# Patient Record
Sex: Female | Born: 1990 | Race: Black or African American | Marital: Single
Health system: Northeastern US, Academic
[De-identification: ages and names within clinical notes are randomized; demographics above are authoritative.]

## PROBLEM LIST (undated history)

## (undated) ENCOUNTER — Inpatient Hospital Stay (HOSPITAL_COMMUNITY): Payer: Self-pay

## (undated) ENCOUNTER — Ambulatory Visit: Payer: Self-pay | Source: Ambulatory Visit | Admitting: Occupational Medicine

## (undated) DIAGNOSIS — R7611 Nonspecific reaction to tuberculin skin test without active tuberculosis: Secondary | ICD-10-CM

---

## 2013-07-19 ENCOUNTER — Telehealth: Payer: Self-pay

## 2013-07-19 NOTE — Telephone Encounter (Signed)
Patient is calling desiring [facility] test [facility]    Her friend Danielle Dessaris helped translate for her.

## 2013-07-24 NOTE — Telephone Encounter (Signed)
lvm for pt to call whp.  **no active insurance**

## 2013-07-25 NOTE — Telephone Encounter (Signed)
Spoke with pt, appt is no longer needed.

## 2013-07-26 ENCOUNTER — Ambulatory Visit
Admit: 2013-07-26 | Discharge: 2013-07-26 | Disposition: A | Discharge status: Home / Self Care | Payer: Self-pay | Source: Ambulatory Visit | Attending: Obstetrics and Gynecology | Admitting: Obstetrics and Gynecology

## 2013-07-26 LAB — RH GROUP: RH Group: POSITIVE

## 2013-07-26 LAB — RH TYPING: Rh Type: POSITIVE

## 2014-02-05 ENCOUNTER — Encounter (HOSPITAL_COMMUNITY): Payer: Self-pay | Admitting: Medical

## 2014-02-05 ENCOUNTER — Inpatient Hospital Stay (HOSPITAL_COMMUNITY)
Admission: AD | Admit: 2014-02-05 | Discharge: 2014-02-06 | Disposition: A | Discharge status: Home / Self Care | Payer: Self-pay | Source: Ambulatory Visit | Attending: Obstetrics & Gynecology | Admitting: Obstetrics & Gynecology

## 2014-02-05 ENCOUNTER — Inpatient Hospital Stay (HOSPITAL_COMMUNITY): Payer: Self-pay

## 2014-02-05 DIAGNOSIS — O99891 Other specified diseases and conditions complicating pregnancy: Secondary | ICD-10-CM | POA: Insufficient documentation

## 2014-02-05 DIAGNOSIS — O9989 Other specified diseases and conditions complicating pregnancy, childbirth and the puerperium: Secondary | ICD-10-CM

## 2014-02-05 DIAGNOSIS — R109 Unspecified abdominal pain: Secondary | ICD-10-CM | POA: Insufficient documentation

## 2014-02-05 LAB — CBC WITH DIFFERENTIAL/PLATELET
Basophils Absolute: 0 10*3/uL (ref 0.0–0.1)
Basophils Relative: 0 % (ref 0–1)
EOS ABS: 0.2 10*3/uL (ref 0.0–0.7)
EOS PCT: 2 % (ref 0–5)
HCT: 32 % — ABNORMAL LOW (ref 36.0–46.0)
HEMOGLOBIN: 10.4 g/dL — AB (ref 12.0–15.0)
Lymphocytes Relative: 41 % (ref 12–46)
Lymphs Abs: 3.8 10*3/uL (ref 0.7–4.0)
MCH: 26.6 pg (ref 26.0–34.0)
MCHC: 32.5 g/dL (ref 30.0–36.0)
MCV: 81.8 fL (ref 78.0–100.0)
MONOS PCT: 7 % (ref 3–12)
Monocytes Absolute: 0.7 10*3/uL (ref 0.1–1.0)
Neutro Abs: 4.6 10*3/uL (ref 1.7–7.7)
Neutrophils Relative %: 50 % (ref 43–77)
Platelets: 172 10*3/uL (ref 150–400)
RBC: 3.91 MIL/uL (ref 3.87–5.11)
RDW: 16.4 % — ABNORMAL HIGH (ref 11.5–15.5)
WBC: 9.3 10*3/uL (ref 4.0–10.5)

## 2014-02-05 LAB — URINALYSIS, ROUTINE W REFLEX MICROSCOPIC
BILIRUBIN URINE: NEGATIVE
GLUCOSE, UA: NEGATIVE mg/dL
Hgb urine dipstick: NEGATIVE
KETONES UR: NEGATIVE mg/dL
Leukocytes, UA: NEGATIVE
Nitrite: NEGATIVE
PH: 5.5 (ref 5.0–8.0)
Protein, ur: NEGATIVE mg/dL
Specific Gravity, Urine: <1.005 — ABNORMAL LOW (ref 1.005–1.030)
Urobilinogen, UA: 0.2 mg/dL (ref 0.0–1.0)

## 2014-02-05 LAB — POCT PREGNANCY, URINE: PREG TEST UR: POSITIVE — AB

## 2014-02-05 LAB — HCG, QUANTITATIVE, PREGNANCY: hCG, Beta Chain, Quant, S: 6102 m[IU]/mL — ABNORMAL HIGH (ref <5)

## 2014-02-05 NOTE — MAU Note (Signed)
Pt reports she has had abd pain for the last 3 days, hurts in the lower part and to the left side. Positive home preg test last week. LMP 12/31/2013

## 2014-02-06 ENCOUNTER — Encounter (HOSPITAL_COMMUNITY): Payer: Self-pay | Admitting: *Deleted

## 2014-02-06 DIAGNOSIS — O9989 Other specified diseases and conditions complicating pregnancy, childbirth and the puerperium: Secondary | ICD-10-CM

## 2014-02-06 DIAGNOSIS — O99891 Other specified diseases and conditions complicating pregnancy: Secondary | ICD-10-CM

## 2014-02-06 DIAGNOSIS — R109 Unspecified abdominal pain: Secondary | ICD-10-CM

## 2014-02-06 LAB — WET PREP, GENITAL
TRICH WET PREP: NONE SEEN
YEAST WET PREP: NONE SEEN

## 2014-02-06 LAB — HIV ANTIBODY (ROUTINE TESTING W REFLEX): HIV 1&2 Ab, 4th Generation: NONREACTIVE

## 2014-02-06 LAB — ABO/RH: ABO/RH(D): O POS

## 2014-02-06 LAB — GC/CHLAMYDIA PROBE AMP
CT Probe RNA: NEGATIVE
GC Probe RNA: NEGATIVE

## 2014-02-06 NOTE — MAU Note (Signed)
Patient discharge home during downtime at 0129 with first trimester pregnancy education sheet.

## 2014-02-06 NOTE — MAU Provider Note (Signed)
History     CSN: [id number]  Arrival date and time: 02/05/14 2033   None     Chief Complaint  Patient presents with  . Dysuria  . Abdominal Pain  . Possible Pregnancy   HPI [name] is a 23 y.o. G1P0 at [redacted]w[redacted]d who presents to MAU today with complain of lower abdominal pain today. She also endorses occasional nausea without vomiting, diarrhea or constipation. She denies vaginal discharge or bleeding.   OB History   Grav Para Term Preterm Abortions TAB SAB Ect Mult Living   1               History reviewed. No pertinent past medical history.  History reviewed. No pertinent past surgical history.  History reviewed. No pertinent family history.  History  Substance Use Topics  . Smoking status: Former Smoker    Quit date: 01/30/2014  . Smokeless tobacco: Not on file  . Alcohol Use: No    Allergies: No Known Allergies  No prescriptions prior to admission    Review of Systems  Constitutional: Negative for malaise/fatigue.  Gastrointestinal: Positive for nausea and abdominal pain. Negative for vomiting, diarrhea and constipation.  Genitourinary:       Neg - vaginal bleeding, discharge   Physical Exam   Blood pressure 109/66, pulse 79, temperature 99.2 F (37.3 C), temperature source Oral, resp. rate 18, height 5\' 4"  (1.626 m), weight 156 lb (70.761 kg), last menstrual period 12/31/2013, SpO2 100.00%, unknown if currently breastfeeding.  Physical Exam  Constitutional: She is oriented to person, place, and time. She appears well-developed and well-nourished.  HENT:  Head: Normocephalic.  Cardiovascular: Normal rate.   Respiratory: Effort normal.  GI: Soft. She exhibits no distension and no mass. There is no tenderness. There is no rebound and no guarding.  Genitourinary: Uterus is not enlarged and not tender. Cervix exhibits no motion tenderness, no discharge and no friability. Right adnexum displays no mass and no tenderness. Left adnexum displays no  mass and no tenderness. No bleeding around the vagina. Vaginal discharge (scant thin, white discharge noted) found.  Neurological: She is alert and oriented to person, place, and time.  Skin: Skin is warm and dry. No erythema.  Psychiatric: She has a normal mood and affect.   Results for orders placed during the hospital encounter of 02/05/14 (from the past 24 hour(s))  URINALYSIS, ROUTINE W REFLEX MICROSCOPIC     Status: Abnormal   Collection Time    02/05/14  8:40 PM      Result Value Ref Range   Color, Urine YELLOW  YELLOW   APPearance CLEAR  CLEAR   Specific Gravity, Urine <1.005 (*) 1.005 - 1.030   pH 5.5  5.0 - 8.0   Glucose, UA NEGATIVE  NEGATIVE mg/dL   Hgb urine dipstick NEGATIVE  NEGATIVE   Bilirubin Urine NEGATIVE  NEGATIVE   Ketones, ur NEGATIVE  NEGATIVE mg/dL   Protein, ur NEGATIVE  NEGATIVE mg/dL   Urobilinogen, UA 0.2  0.0 - 1.0 mg/dL   Nitrite NEGATIVE  NEGATIVE   Leukocytes, UA NEGATIVE  NEGATIVE  POCT PREGNANCY, URINE     Status: Abnormal   Collection Time    02/05/14  8:50 PM      Result Value Ref Range   Preg Test, Ur POSITIVE (*) NEGATIVE  ABO/RH     Status: None   Collection Time    02/05/14 10:02 PM      Result Value Ref Range   ABO/RH(D) [place]  POS    CBC WITH DIFFERENTIAL     Status: Abnormal   Collection Time    02/05/14 10:07 PM      Result Value Ref Range   WBC 9.3  4.0 - 10.5 K/uL   RBC 3.91  3.87 - 5.11 MIL/uL   Hemoglobin 10.4 (*) 12.0 - 15.0 g/dL   HCT 93.8 (*) 18.2 - 99.3 %   MCV 81.8  78.0 - 100.0 fL   MCH 26.6  26.0 - 34.0 pg   MCHC 32.5  30.0 - 36.0 g/dL   RDW 71.6 (*) 96.7 - 89.3 %   Platelets 172  150 - 400 K/uL   Neutrophils Relative % 50  43 - 77 %   Neutro Abs 4.6  1.7 - 7.7 K/uL   Lymphocytes Relative 41  12 - 46 %   Lymphs Abs 3.8  0.7 - 4.0 K/uL   Monocytes Relative 7  3 - 12 %   Monocytes Absolute 0.7  0.1 - 1.0 K/uL   Eosinophils Relative 2  0 - 5 %   Eosinophils Absolute 0.2  0.0 - 0.7 K/uL   Basophils Relative 0  0 - 1 %    Basophils Absolute 0.0  0.0 - 0.1 K/uL  HCG, QUANTITATIVE, PREGNANCY     Status: Abnormal   Collection Time    02/05/14 10:07 PM      Result Value Ref Range   hCG, Beta Chain, Quant, S 6102 (*) <5 mIU/mL  WET PREP, GENITAL     Status: Abnormal   Collection Time    02/06/14 12:38 AM      Result Value Ref Range   Yeast Wet Prep HPF POC NONE SEEN  NONE SEEN   Trich, Wet Prep NONE SEEN  NONE SEEN   Clue Cells Wet Prep HPF POC FEW (*) NONE SEEN   WBC, Wet Prep HPF POC FEW (*) NONE SEEN    US Ob Comp Less 14 Wks  02/05/2014   CLINICAL DATA:  Left lower quadrant abdominal pain.  EXAM: OBSTETRIC <14 WK [place] AND TRANSVAGINAL OB US  TECHNIQUE: Both transabdominal and transvaginal ultrasound examinations were performed for complete evaluation of the gestation as well as the maternal uterus, adnexal regions, and pelvic cul-de-sac. Transvaginal technique was performed to assess early pregnancy.  COMPARISON:  None.  FINDINGS: Intrauterine gestational sac: Visualized/normal in shape.  Yolk sac:  Yes  Embryo:  No  Cardiac Activity: N/A  MSD: 6.6 mm   5 w   2  d  Maternal uterus/adnexae: No subchorionic hemorrhage is noted. The uterus is otherwise unremarkable in appearance.  The ovaries are within normal limits. The right ovary measures 3.0 x 1.8 x 2.3 cm, while the left ovary measures 2.9 x 2.2 x 2.3 cm. No suspicious adnexal masses are seen; there is no evidence for ovarian torsion.  No free fluid is seen within the pelvic cul-de-sac.  IMPRESSION: Single intrauterine gestational sac noted, with a mean sac diameter of 6.6 mm, corresponding to a gestational age of [redacted] weeks 2 days. This matches the gestational age of [redacted] weeks 1 day by LMP, reflecting an estimated date of delivery of October 07, 2014. The embryo is not yet visualized.   Electronically Signed   By: Roanna Raider M.D.   On: 02/05/2014 23:15   US Ob Transvaginal  02/05/2014   CLINICAL DATA:  Left lower quadrant abdominal pain.  EXAM: OBSTETRIC <14 WK  [place] AND TRANSVAGINAL OB US  TECHNIQUE: Both transabdominal  and transvaginal ultrasound examinations were performed for complete evaluation of the gestation as well as the maternal uterus, adnexal regions, and pelvic cul-de-sac. Transvaginal technique was performed to assess early pregnancy.  COMPARISON:  None.  FINDINGS: Intrauterine gestational sac: Visualized/normal in shape.  Yolk sac:  Yes  Embryo:  No  Cardiac Activity: N/A  MSD: 6.6 mm   5 w   2  d  Maternal uterus/adnexae: No subchorionic hemorrhage is noted. The uterus is otherwise unremarkable in appearance.  The ovaries are within normal limits. The right ovary measures 3.0 x 1.8 x 2.3 cm, while the left ovary measures 2.9 x 2.2 x 2.3 cm. No suspicious adnexal masses are seen; there is no evidence for ovarian torsion.  No free fluid is seen within the pelvic cul-de-sac.  IMPRESSION: Single intrauterine gestational sac noted, with a mean sac diameter of 6.6 mm, corresponding to a gestational age of [redacted] weeks 2 days. This matches the gestational age of [redacted] weeks 1 day by LMP, reflecting an estimated date of delivery of October 07, 2014. The embryo is not yet visualized.   Electronically Signed   By: Roanna RaiderJeffery  Chang M.D.   On: 02/05/2014 23:15     MAU Course  Procedures None  MDM +UPT UA, wet prep, GC/Chlamydia, CBC, ABO/Rh, quant hCG and US today  Assessment and Plan  A: SIUP at 5948w2d Abdominal pain in pregnancy, antepartum  P: Discharge home First trimester warning signs discussed Patient plans to move back to [place] within 1-2 months. Will start prenatal care there.  Patient may return to MAU as needed or if her condition were to change or worsen   Freddi StarrJulie N Ethier, PA-C  02/06/2014, 4:46 AM

## 2014-02-06 NOTE — MAU Provider Note (Signed)
Attestation of Attending Supervision of Advanced Practitioner (CNM/NP): Evaluation and management procedures were performed by the Advanced Practitioner under my supervision and collaboration.  I have reviewed the Advanced Practitioner's note and chart, and I agree with the management and plan.  HARRAWAY-SMITH, Sabree Nuon 4:52 AM

## 2014-04-30 ENCOUNTER — Encounter (HOSPITAL_COMMUNITY): Payer: Self-pay | Admitting: *Deleted

## 2014-05-07 ENCOUNTER — Ambulatory Visit
Admit: 2014-05-07 | Discharge: 2014-05-07 | Disposition: A | Discharge status: Home / Self Care | Payer: Self-pay | Source: Ambulatory Visit | Admitting: Internal Medicine

## 2014-12-11 ENCOUNTER — Encounter (HOSPITAL_COMMUNITY): Payer: Self-pay | Admitting: *Deleted

## 2015-05-12 DIAGNOSIS — Z862 Personal history of diseases of the blood and blood-forming organs and certain disorders involving the immune mechanism: Secondary | ICD-10-CM | POA: Insufficient documentation

## 2015-09-03 ENCOUNTER — Other Ambulatory Visit: Payer: Self-pay | Admitting: Pulmonology

## 2015-09-17 DIAGNOSIS — R7611 Nonspecific reaction to tuberculin skin test without active tuberculosis: Secondary | ICD-10-CM | POA: Insufficient documentation

## 2015-11-27 ENCOUNTER — Other Ambulatory Visit
Admission: RE | Admit: 2015-11-27 | Discharge: 2015-11-27 | Disposition: A | Discharge status: Home / Self Care | Payer: Self-pay | Source: Ambulatory Visit | Attending: General Practice | Admitting: General Practice

## 2015-11-28 LAB — SYPHILIS SCREEN
Syphilis Screen: NEGATIVE
Syphilis Status: NONREACTIVE

## 2015-11-28 LAB — MUMPS ANTIBODY, IGG: Mumps IgG: POSITIVE

## 2015-11-28 LAB — HEPATITIS A ANTIBODY, IGM: Hep A IgM: NEGATIVE

## 2015-11-28 LAB — VARICELLA ZOSTER IGG AB: VZV IgG: POSITIVE

## 2015-11-28 LAB — N. GONORRHOEAE DNA AMPLIFICATION: N. gonorrhoeae DNA Amplification: 0

## 2015-11-28 LAB — MEASLES IGG AB: Measles IgG: POSITIVE

## 2015-11-28 LAB — RUBELLA ANTIBODY, IGG: Rubella IgG AB: POSITIVE

## 2015-11-28 LAB — HEPATITIS A IGG AB: Hepatitis A IGG: POSITIVE

## 2016-02-03 IMAGING — US US OB COMP LESS 14 WK
1 series · 13 of 28 positions shown · non-contrast
Comparison: None.

CLINICAL DATA: Left lower quadrant abdominal pain.

EXAM:
OBSTETRIC <14 WK US AND TRANSVAGINAL OB US
TECHNIQUE: Both transabdominal and transvaginal ultrasound examinations were
performed for complete evaluation of the gestation as well as the
maternal uterus, adnexal regions, and pelvic cul-de-sac.
Transvaginal technique was performed to assess early pregnancy.

[Series 1: us ob comp less 14 wks · 13 of 42 slices shown]
[im 2/42]
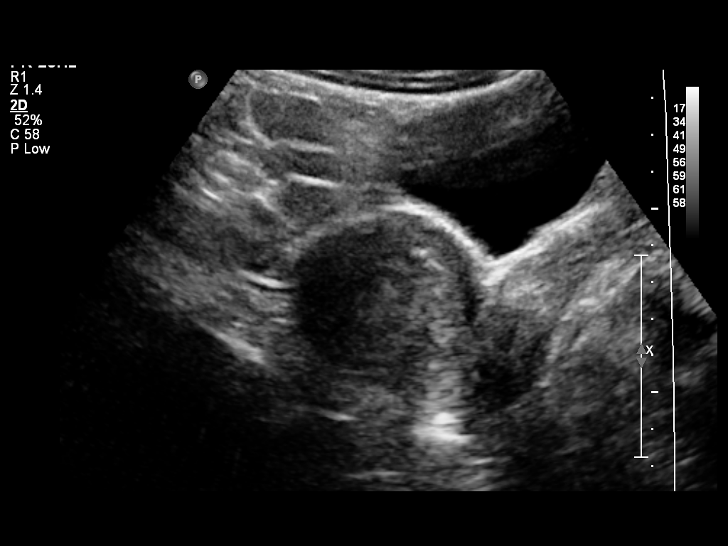
[im 5/42]
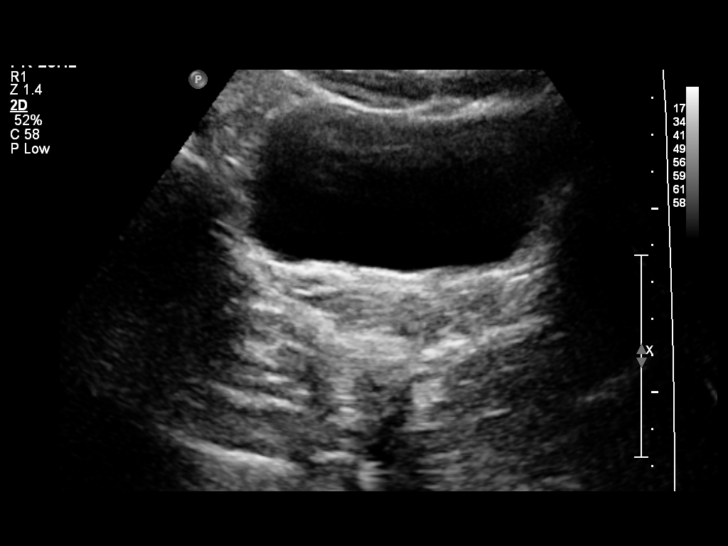
[im 8/42]
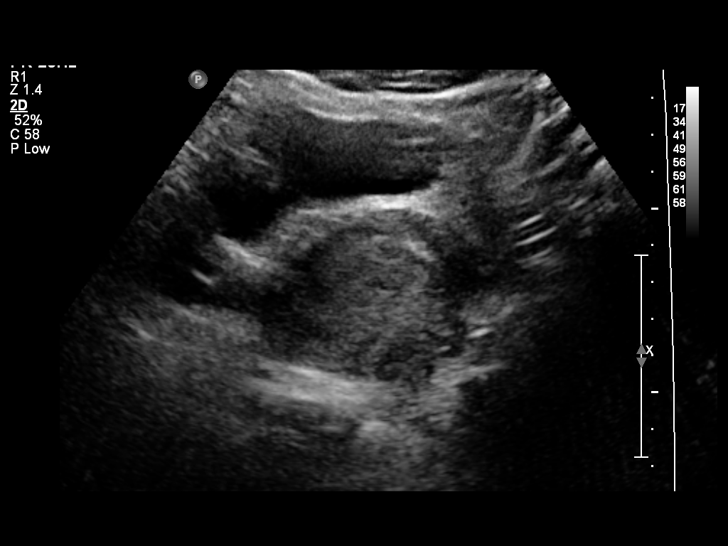
[im 11/42]
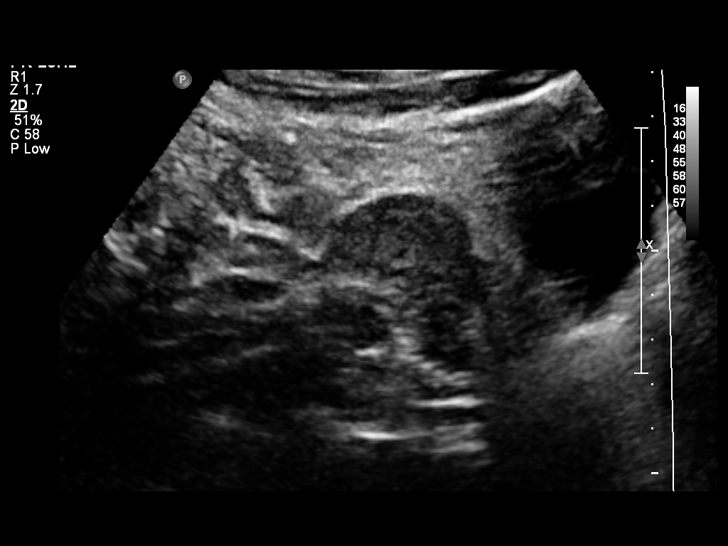
[im 14/42]
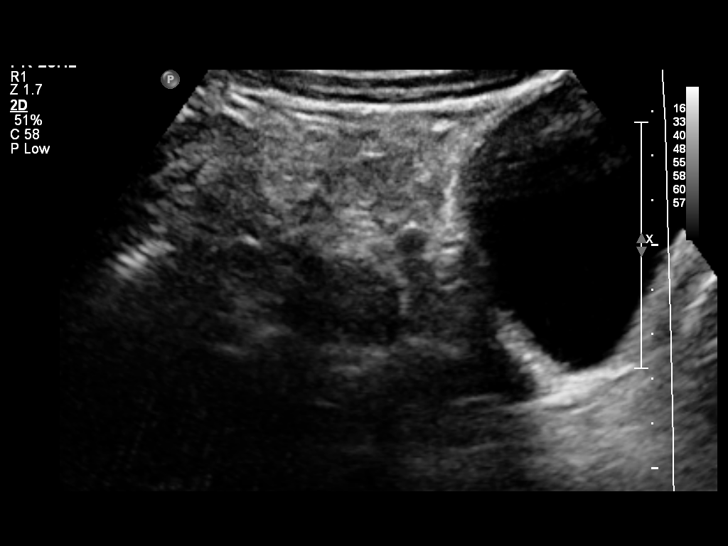
[im 17/42]
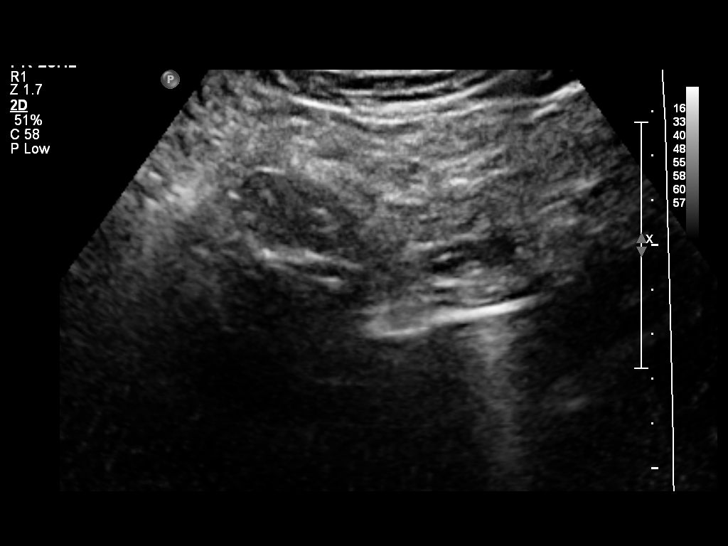
[im 22/42]
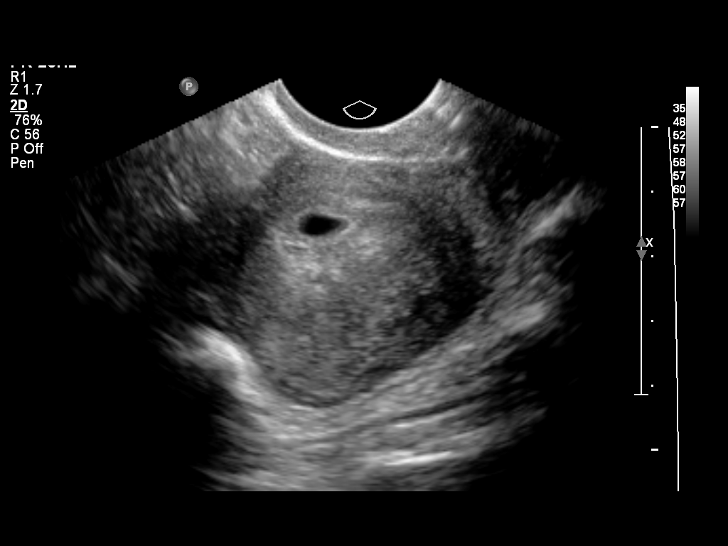
[im 25/42]
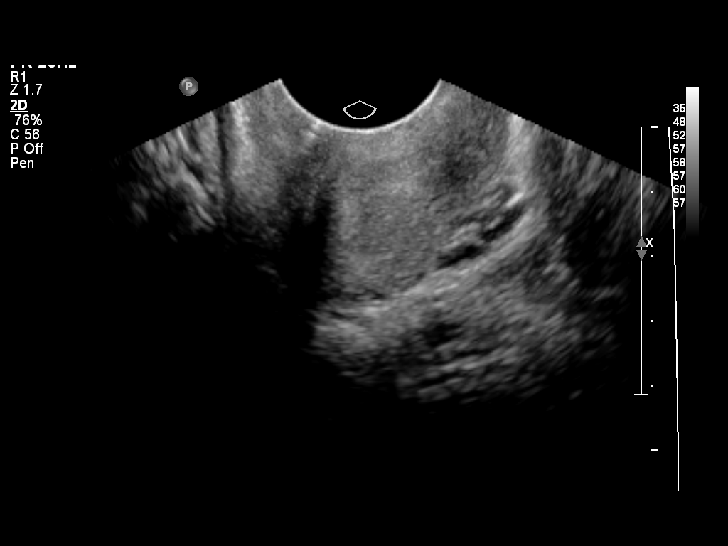
[im 28/42]
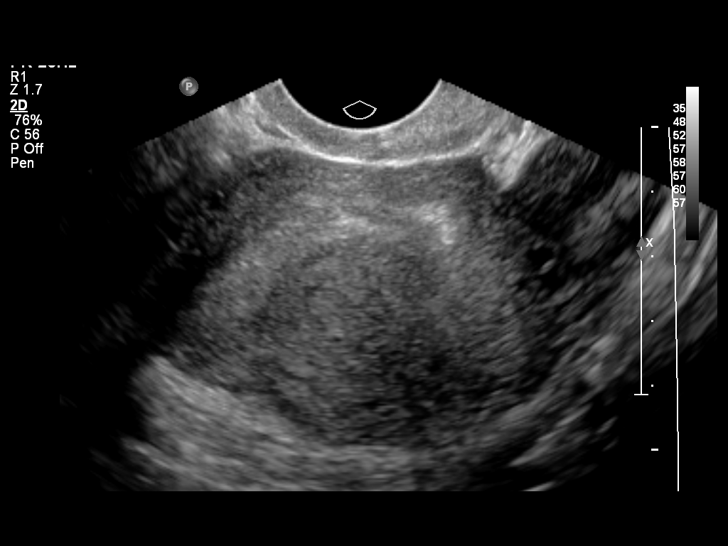
[im 31/42]
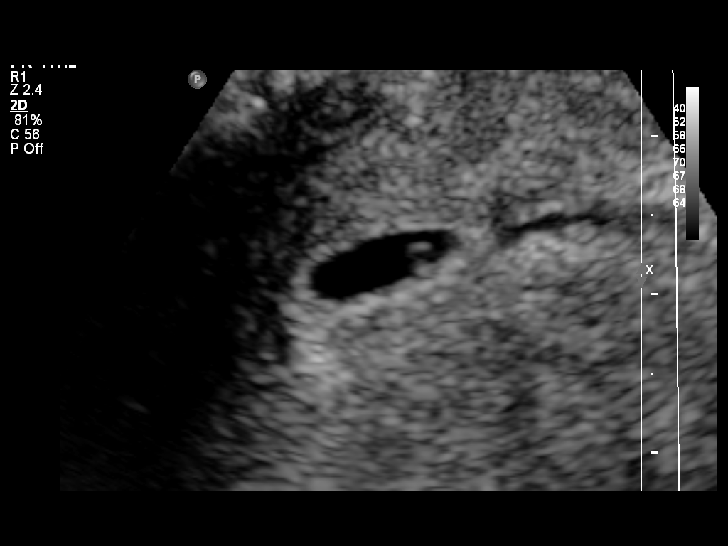
[im 34/42]
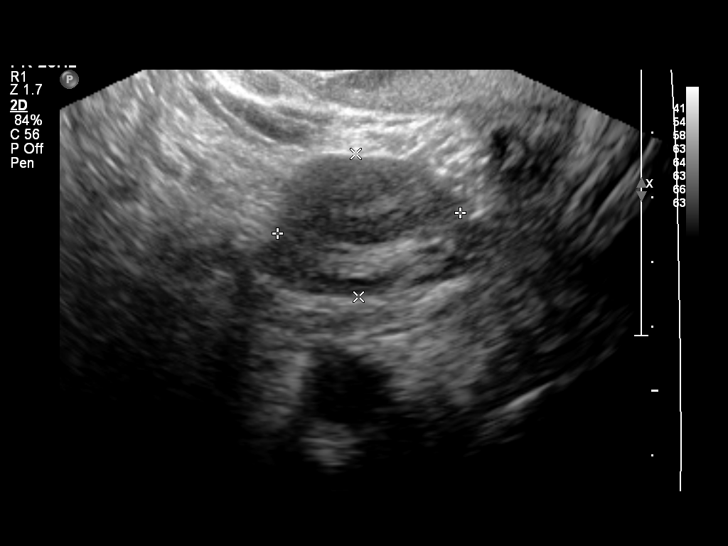
[im 37/42]
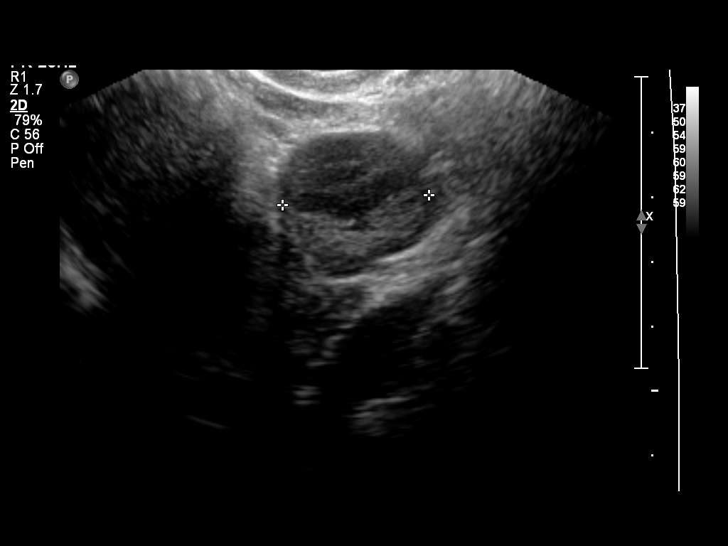
[im 40/42]
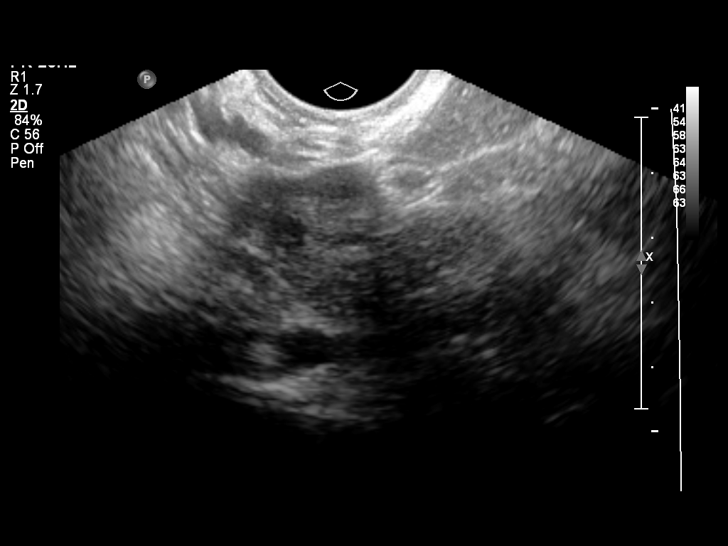

[13 of 28 positions shown; findings below may reference images not displayed]

FINDINGS: Intrauterine gestational sac: Visualized/normal in shape.

Yolk sac:  Yes

Embryo:  No

Cardiac Activity: N/A

MSD: 6.6 mm   5 w   2  d

Maternal uterus/adnexae: No subchorionic hemorrhage is noted. The
uterus is otherwise unremarkable in appearance.

The ovaries are within normal limits. The right ovary measures 3.0 x
1.8 x 2.3 cm, while the left ovary measures 2.9 x 2.2 x 2.3 cm. No
suspicious adnexal masses are seen; there is no evidence for ovarian
torsion.

No free fluid is seen within the pelvic cul-de-sac.
IMPRESSION: Single intrauterine gestational sac noted, with a mean sac diameter
of 6.6 mm, corresponding to a gestational age of 5 weeks 2 days.
This matches the gestational age of 5 weeks 1 day by LMP, reflecting
an estimated date of delivery October 07, 2014. The embryo is not
yet visualized.

## 2016-06-17 ENCOUNTER — Inpatient Hospital Stay (HOSPITAL_COMMUNITY)
Admission: AD | Admit: 2016-06-17 | Discharge: 2016-06-17 | Disposition: A | Discharge status: Home / Self Care | Payer: Medicaid Other | Source: Ambulatory Visit | Attending: Obstetrics & Gynecology | Admitting: Obstetrics & Gynecology

## 2016-06-17 DIAGNOSIS — Z3201 Encounter for pregnancy test, result positive: Secondary | ICD-10-CM | POA: Insufficient documentation

## 2016-06-18 ENCOUNTER — Encounter: Payer: Self-pay | Admitting: *Deleted

## 2016-06-18 ENCOUNTER — Ambulatory Visit (INDEPENDENT_AMBULATORY_CARE_PROVIDER_SITE_OTHER): Payer: Medicaid Other | Admitting: *Deleted

## 2016-06-18 DIAGNOSIS — Z3201 Encounter for pregnancy test, result positive: Secondary | ICD-10-CM | POA: Diagnosis not present

## 2016-06-18 DIAGNOSIS — Z3200 Encounter for pregnancy test, result unknown: Secondary | ICD-10-CM

## 2016-06-18 DIAGNOSIS — Z3A01 Less than 8 weeks gestation of pregnancy: Secondary | ICD-10-CM

## 2016-06-18 LAB — POCT PREGNANCY, URINE: Preg Test, Ur: POSITIVE — AB

## 2016-06-18 MED ORDER — PRENATAL VITAMINS 0.8 MG PO TABS: 1.0000 | ORAL_TABLET | Freq: Every day | ORAL | 12 refills | Status: DC

## 2016-06-18 NOTE — Progress Notes (Signed)
Patient presents to clinic for pregnancy test which is positive. Reviewed allergies and medications, prescription sent for prenatal vitamins. Patient will not be starting care at this clinic, pregnancy verification letter given.

## 2016-07-02 ENCOUNTER — Inpatient Hospital Stay (HOSPITAL_COMMUNITY): Payer: Medicaid Other

## 2016-07-02 ENCOUNTER — Inpatient Hospital Stay (HOSPITAL_COMMUNITY)
Admission: AD | Admit: 2016-07-02 | Discharge: 2016-07-02 | Disposition: A | Discharge status: Home / Self Care | Payer: Medicaid Other | Source: Ambulatory Visit | Attending: Obstetrics & Gynecology | Admitting: Obstetrics & Gynecology

## 2016-07-02 ENCOUNTER — Encounter (HOSPITAL_COMMUNITY): Payer: Self-pay | Admitting: *Deleted

## 2016-07-02 DIAGNOSIS — Z3A01 Less than 8 weeks gestation of pregnancy: Secondary | ICD-10-CM | POA: Diagnosis not present

## 2016-07-02 DIAGNOSIS — Z87891 Personal history of nicotine dependence: Secondary | ICD-10-CM | POA: Insufficient documentation

## 2016-07-02 DIAGNOSIS — O26899 Other specified pregnancy related conditions, unspecified trimester: Secondary | ICD-10-CM

## 2016-07-02 DIAGNOSIS — R109 Unspecified abdominal pain: Secondary | ICD-10-CM

## 2016-07-02 DIAGNOSIS — O26892 Other specified pregnancy related conditions, second trimester: Secondary | ICD-10-CM | POA: Diagnosis present

## 2016-07-02 DIAGNOSIS — O208 Other hemorrhage in early pregnancy: Secondary | ICD-10-CM | POA: Insufficient documentation

## 2016-07-02 DIAGNOSIS — N8312 Corpus luteum cyst of left ovary: Secondary | ICD-10-CM | POA: Diagnosis not present

## 2016-07-02 DIAGNOSIS — O9989 Other specified diseases and conditions complicating pregnancy, childbirth and the puerperium: Secondary | ICD-10-CM

## 2016-07-02 DIAGNOSIS — Z3491 Encounter for supervision of normal pregnancy, unspecified, first trimester: Secondary | ICD-10-CM

## 2016-07-02 DIAGNOSIS — O468X1 Other antepartum hemorrhage, first trimester: Secondary | ICD-10-CM

## 2016-07-02 DIAGNOSIS — O418X10 Other specified disorders of amniotic fluid and membranes, first trimester, not applicable or unspecified: Secondary | ICD-10-CM

## 2016-07-02 LAB — URINALYSIS, ROUTINE W REFLEX MICROSCOPIC
BILIRUBIN URINE: NEGATIVE
Glucose, UA: NEGATIVE mg/dL
HGB URINE DIPSTICK: NEGATIVE
Ketones, ur: NEGATIVE mg/dL
Leukocytes, UA: NEGATIVE
Nitrite: NEGATIVE
PROTEIN: NEGATIVE mg/dL
Specific Gravity, Urine: 1.019 (ref 1.005–1.030)
pH: 6 (ref 5.0–8.0)

## 2016-07-02 LAB — CBC
HCT: 30.9 % — ABNORMAL LOW (ref 36.0–46.0)
Hemoglobin: 10.1 g/dL — ABNORMAL LOW (ref 12.0–15.0)
MCH: 25.3 pg — AB (ref 26.0–34.0)
MCHC: 32.7 g/dL (ref 30.0–36.0)
MCV: 77.4 fL — AB (ref 78.0–100.0)
Platelets: 147 10*3/uL — ABNORMAL LOW (ref 150–400)
RBC: 3.99 MIL/uL (ref 3.87–5.11)
RDW: 16.2 % — ABNORMAL HIGH (ref 11.5–15.5)
WBC: 5.2 10*3/uL (ref 4.0–10.5)

## 2016-07-02 LAB — WET PREP, GENITAL
Clue Cells Wet Prep HPF POC: NONE SEEN
Sperm: NONE SEEN
Trich, Wet Prep: NONE SEEN
WBC, Wet Prep HPF POC: NONE SEEN
Yeast Wet Prep HPF POC: NONE SEEN

## 2016-07-02 LAB — HCG, QUANTITATIVE, PREGNANCY: HCG, BETA CHAIN, QUANT, S: 52521 m[IU]/mL — AB (ref <5)

## 2016-07-02 NOTE — MAU Note (Signed)
Having pain in lower abd, makes her want to go to the bathroom.  Has pain when she goes to bathroom.  Last night she saw a little blood when she went to the bathroom (urinated). Having pain in left lower back.  Had a fever 3 days ago.  +CVA tenderness

## 2016-07-02 NOTE — Discharge Instructions (Signed)
Subchorionic Hematoma °A subchorionic hematoma is a gathering of blood between the outer wall of the placenta and the inner wall of the womb (uterus). The placenta is the organ that connects the fetus to the wall of the uterus. The placenta performs the feeding, breathing (oxygen to the fetus), and waste removal (excretory work) of the fetus.  °Subchorionic hematoma is the most common abnormality found on a result from ultrasonography done during the first trimester or early second trimester of pregnancy. If there has been little or no vaginal bleeding, early small hematomas usually shrink on their own and do not affect your baby or pregnancy. The blood is gradually absorbed over 1-2 weeks. When bleeding starts later in pregnancy or the hematoma is larger or occurs in an older pregnant woman, the outcome may not be as good. Larger hematomas may get bigger, which increases the chances for miscarriage. Subchorionic hematoma also increases the risk of premature detachment of the placenta from the uterus, preterm (premature) labor, and stillbirth. °HOME CARE INSTRUCTIONS °· Stay on bed rest if your health care provider recommends this. Although bed rest will not prevent more bleeding or prevent a miscarriage, your health care provider may recommend bed rest until you are advised otherwise. °· Avoid heavy lifting (more than 10 lb [4.5 kg]), exercise, sexual intercourse, or douching as directed by your health care provider. °· Keep track of the number of pads you use each day and how soaked (saturated) they are. Write down this information. °· Do not use tampons. °· Keep all follow-up appointments as directed by your health care provider. Your health care provider may ask you to have follow-up blood tests or ultrasound tests or both. °SEEK IMMEDIATE MEDICAL CARE IF: °· You have severe cramps in your stomach, back, abdomen, or pelvis. °· You have a fever. °· You pass large clots or tissue. Save any tissue for your health  care provider to look at. °· Your bleeding increases or you become lightheaded, feel weak, or have fainting episodes. °This information is not intended to replace advice given to you by your health care provider. Make sure you discuss any questions you have with your health care provider. °Document Released: 09/29/2006 Document Revised: 07/05/2014 Document Reviewed: 01/11/2013 °Elsevier Interactive Patient Education © 2017 [employer] ° °

## 2016-07-02 NOTE — MAU Provider Note (Signed)
History     CSN: [id number]  Arrival date and time: 07/02/16 1111   First Provider Initiated Contact with Patient 07/02/16 1150      Chief Complaint  Patient presents with  . UTI symptoms  . Back Pain   HPI Ariana Hammond is a 26 y.o. G2P0 at [redacted]w[redacted]d by LMP who presents with abdominal pain & vaginal bleeding. Pain began last night. Describes as lower abdominal cramping. Rates pain 8/10. No treatment. Noted red spotting on toilet paper last night; none since. Some nausea. Denied vomiting, diarrhea, constipation, dysuria, or vaginal discharge. Has not started prenatal care. No fever/chills.   OB History    Gravida Para Term Preterm AB Living   2         1   SAB TAB Ectopic Multiple Live Births                  History reviewed. No pertinent past medical history.  Past Surgical History:  Procedure Laterality Date  . CESAREAN SECTION      No family history on file.  Social History  Substance Use Topics  . Smoking status: Former Smoker    Quit date: 01/30/2014  . Smokeless tobacco: Former Neurosurgeon  . Alcohol use Yes     Comment: Social    Allergies: No Known Allergies  Prescriptions Prior to Admission  Medication Sig Dispense Refill Last Dose  . Prenatal Multivit-Min-Fe-FA (PRENATAL VITAMINS) 0.8 MG tablet Take 1 tablet by mouth daily. (Patient not taking: Reported on 07/02/2016) 30 tablet 12 Not Taking at Unknown time    Review of Systems  Constitutional: Negative.   Respiratory: Negative for shortness of breath.   Cardiovascular: Negative for chest pain.  Gastrointestinal: Negative for abdominal pain, nausea and vomiting.  Genitourinary: Negative for vaginal bleeding.  Neurological: Negative for dizziness and headaches.   Physical Exam   Blood pressure 106/63, pulse 71, temperature 98.2 F (36.8 C), temperature source Oral, resp. rate 18, weight 164 lb 8 oz (74.6 kg), last menstrual period 05/12/2016, SpO2 100 %, unknown if currently breastfeeding.  Physical Exam   Nursing note and vitals reviewed. Constitutional: She is oriented to person, place, and time. She appears well-developed and well-nourished. No distress.  HENT:  Head: Normocephalic and atraumatic.  Eyes: Conjunctivae are normal. Right eye exhibits no discharge. Left eye exhibits no discharge. No scleral icterus.  Neck: Normal range of motion.  Cardiovascular: Normal rate, regular rhythm and normal heart sounds.   No murmur heard. Respiratory: Effort normal and breath sounds normal. No respiratory distress. She has no wheezes.  GI: Soft. Bowel sounds are normal. She exhibits no distension. There is no tenderness. There is no CVA tenderness.  Genitourinary: No bleeding in the vagina. Vaginal discharge (small amount of thin white discharge) found.  Musculoskeletal: Edema: BLE 2+  Neurological: She is alert and oriented to person, place, and time.  Skin: Skin is warm and dry. She is not diaphoretic.  Psychiatric: She has a normal mood and affect. Her behavior is normal. Judgment and thought content normal.    MAU Course  Procedures Results for orders placed or performed during the hospital encounter of 07/02/16 (from the past 24 hour(s))  Urinalysis, Routine w reflex microscopic     Status: Abnormal   Collection Time: 07/02/16 11:25 AM  Result Value Ref Range   Color, Urine YELLOW YELLOW   APPearance HAZY (A) CLEAR   Specific Gravity, Urine 1.019 1.005 - 1.030   pH 6.0 5.0 - 8.0  Glucose, UA NEGATIVE NEGATIVE mg/dL   Hgb urine dipstick NEGATIVE NEGATIVE   Bilirubin Urine NEGATIVE NEGATIVE   Ketones, ur NEGATIVE NEGATIVE mg/dL   Protein, ur NEGATIVE NEGATIVE mg/dL   Nitrite NEGATIVE NEGATIVE   Leukocytes, UA NEGATIVE NEGATIVE  hCG, quantitative, pregnancy     Status: Abnormal   Collection Time: 07/02/16 12:12 PM  Result Value Ref Range   hCG, Beta Chain, Quant, S 52,521 (H) <5 mIU/mL  Wet prep, genital     Status: None   Collection Time: 07/02/16 12:28 PM  Result Value Ref  Range   Yeast Wet Prep HPF POC NONE SEEN NONE SEEN   Trich, Wet Prep NONE SEEN NONE SEEN   Clue Cells Wet Prep HPF POC NONE SEEN NONE SEEN   WBC, Wet Prep HPF POC NONE SEEN NONE SEEN   Sperm NONE SEEN   CBC     Status: Abnormal   Collection Time: 07/02/16 12:41 PM  Result Value Ref Range   WBC 5.2 4.0 - 10.5 K/uL   RBC 3.99 3.87 - 5.11 MIL/uL   Hemoglobin 10.1 (L) 12.0 - 15.0 g/dL   HCT 11.930.9 (L) 14.736.0 - 82.946.0 %   MCV 77.4 (L) 78.0 - 100.0 fL   MCH 25.3 (L) 26.0 - 34.0 pg   MCHC 32.7 30.0 - 36.0 g/dL   RDW 56.216.2 (H) 13.011.5 - 86.515.5 %   Platelets 147 (L) 150 - 400 K/uL   [place] Ob Comp Less 14 Wks  Result Date: 07/02/2016 CLINICAL DATA:  Abdominal pain affecting pregnancy ; EGA [redacted] weeks 2 days by LMP, quantitative beta HCG = 52,521 EXAM: OBSTETRIC <14 WK US AND TRANSVAGINAL OB US TECHNIQUE: Both transabdominal and transvaginal ultrasound examinations were performed for complete evaluation of the gestation as well as the maternal uterus, adnexal regions, and pelvic cul-de-sac. Transvaginal technique was performed to assess early pregnancy. COMPARISON:  None for this gestation FINDINGS: Intrauterine gestational sac: Present Yolk sac:  Present Embryo:  Present Cardiac Activity: Present Heart Rate: 130  bpm CRL:  8.9  mm   6 w   5 d                  US EDC: 02/20/2017 Subchorionic hemorrhage: Small subchronic hemorrhage 2.3 x 1.3 x 2.8 cm. Maternal uterus/adnexae: Uterus otherwise normal appearance. Ovaries normal size morphology with small corpus luteal cyst in LEFT ovary. Small amount of free pelvic fluid. No adnexal masses. IMPRESSION: Single live intrauterine gestation measured at 6 weeks 5 days EGA by crown-rump length. Small subchorionic hemorrhage as above. Small amount of nonspecific free pelvic fluid. Electronically Signed   By: Ulyses SouthwardMark  Boles M.D.   On: 07/02/2016 14:09   [place] Ob Transvaginal  Result Date: 07/02/2016 CLINICAL DATA:  Abdominal pain affecting pregnancy ; EGA [redacted] weeks 2 days by LMP,  quantitative beta HCG = 52,521 EXAM: OBSTETRIC <14 WK US AND TRANSVAGINAL OB US TECHNIQUE: Both transabdominal and transvaginal ultrasound examinations were performed for complete evaluation of the gestation as well as the maternal uterus, adnexal regions, and pelvic cul-de-sac. Transvaginal technique was performed to assess early pregnancy. COMPARISON:  None for this gestation FINDINGS: Intrauterine gestational sac: Present Yolk sac:  Present Embryo:  Present Cardiac Activity: Present Heart Rate: 130  bpm CRL:  8.9  mm   6 w   5 d                  US EDC: 02/20/2017 Subchorionic hemorrhage: Small subchronic hemorrhage 2.3 x 1.3 x  2.8 cm. Maternal uterus/adnexae: Uterus otherwise normal appearance. Ovaries normal size morphology with small corpus luteal cyst in LEFT ovary. Small amount of free pelvic fluid. No adnexal masses. IMPRESSION: Single live intrauterine gestation measured at 6 weeks 5 days EGA by crown-rump length. Small subchorionic hemorrhage as above. Small amount of nonspecific free pelvic fluid. Electronically Signed   By: Ulyses Southward M.D.   On: 07/02/2016 14:09    MDM +UPT UA, wet prep, GC/chlamydia, CBC, ABO/Rh, quant hCG, HIV, and [place] today to rule out ectopic pregnancy O positive Ultrasound shows SIUP with cardiac activity & small SCH  Assessment and Plan  A:  1. Normal IUP (intrauterine pregnancy) on prenatal ultrasound, first trimester   2. Abdominal pain affecting pregnancy   3. Subchorionic hematoma in first trimester, single or unspecified fetus    P: Discharge home GC/CT pending Start prenatal care Discussed reasons to return to MAU  Judeth Horn 07/02/2016, 12:22 PM

## 2016-07-02 NOTE — MAU Provider Note (Signed)
History     CSN: [id number]  Arrival date and time: 07/02/16 1111   First Provider Initiated Contact with Patient 07/02/16 1150      Chief Complaint  Patient presents with  . UTI symptoms  . Back Pain   HPI Ariana Hammond is a 26 y.o. G2P0 female, currently [redacted]w[redacted]d, who presents to day for evaluation of abdominal pain. Patient reports the pain began last night and she describes the pain as cramping, mainly in the lower abdomen and lower back. Patient reports last night when she went to the bathroom she noted some bleeding, but reports no bleeding today. Patient reports the pain is an 8/10 and has tried nothing to relieve the pain. Patient reports nausea and poor apetitie since the pain began, but no episodes of vomiting. Patient does report some constipation, her last bowel movement was yesterday morning and she reports it was firm. Denies fevers, chills, dysuria or urinary frequency.  Patient reports she last had intercourse on 07/01/15, with no pain or bleeding. Patient has not yet been seen for prenatal care but has an appointment with [facility] at the end of the month.   OB History    Gravida Para Term Preterm AB Living   2         1   SAB TAB Ectopic Multiple Live Births                  History reviewed. No pertinent past medical history.  Past Surgical History:  Procedure Laterality Date  . CESAREAN SECTION      No family history on file.  Social History  Substance Use Topics  . Smoking status: Former Smoker    Quit date: 01/30/2014  . Smokeless tobacco: Former Neurosurgeon  . Alcohol use Yes     Comment: Social    Allergies: No Known Allergies  No prescriptions prior to admission.    Review of Systems  Constitutional: Positive for appetite change. Negative for chills and fever.  Gastrointestinal: Positive for abdominal pain, constipation and nausea. Negative for diarrhea and vomiting.  Genitourinary: Positive for pelvic pain. Negative for dysuria, frequency  and vaginal discharge.  Neurological: Negative for dizziness and light-headedness.   Physical Exam   Blood pressure 106/63, pulse 71, temperature 98.2 F (36.8 C), temperature source Oral, resp. rate 18, weight 74.6 kg (164 lb 8 oz), last menstrual period 05/12/2016, SpO2 100 %, unknown if currently breastfeeding.  Physical Exam  Constitutional: She is oriented to person, place, and time. She appears well-developed and well-nourished. No distress.  Cardiovascular: Normal rate.   Respiratory: Effort normal.  GI: Soft. Bowel sounds are normal. She exhibits no distension. There is tenderness in the suprapubic area and left upper quadrant.  Genitourinary: Cervix exhibits discharge. Cervix exhibits no motion tenderness and no friability. Right adnexum displays tenderness. Left adnexum displays no tenderness. No bleeding in the vagina. Vaginal discharge found.  Musculoskeletal: Normal range of motion.  Neurological: She is alert and oriented to person, place, and time.  Skin: Skin is warm and dry.    Results for orders placed or performed during the hospital encounter of 07/02/16 (from the past 24 hour(s))  Urinalysis, Routine w reflex microscopic     Status: Abnormal   Collection Time: 07/02/16 11:25 AM  Result Value Ref Range   Color, Urine YELLOW YELLOW   APPearance HAZY (A) CLEAR   Specific Gravity, Urine 1.019 1.005 - 1.030   pH 6.0 5.0 - 8.0   Glucose, UA  NEGATIVE NEGATIVE mg/dL   Hgb urine dipstick NEGATIVE NEGATIVE   Bilirubin Urine NEGATIVE NEGATIVE   Ketones, ur NEGATIVE NEGATIVE mg/dL   Protein, ur NEGATIVE NEGATIVE mg/dL   Nitrite NEGATIVE NEGATIVE   Leukocytes, UA NEGATIVE NEGATIVE  hCG, quantitative, pregnancy     Status: Abnormal   Collection Time: 07/02/16 12:12 PM  Result Value Ref Range   hCG, Beta Chain, Quant, S 52,521 (H) <5 mIU/mL  Wet prep, genital     Status: None   Collection Time: 07/02/16 12:28 PM  Result Value Ref Range   Yeast Wet Prep HPF POC NONE SEEN  NONE SEEN   Trich, Wet Prep NONE SEEN NONE SEEN   Clue Cells Wet Prep HPF POC NONE SEEN NONE SEEN   WBC, Wet Prep HPF POC NONE SEEN NONE SEEN   Sperm NONE SEEN   CBC     Status: Abnormal   Collection Time: 07/02/16 12:41 PM  Result Value Ref Range   WBC 5.2 4.0 - 10.5 K/uL   RBC 3.99 3.87 - 5.11 MIL/uL   Hemoglobin 10.1 (L) 12.0 - 15.0 g/dL   HCT 04.530.9 (L) 40.936.0 - 81.146.0 %   MCV 77.4 (L) 78.0 - 100.0 fL   MCH 25.3 (L) 26.0 - 34.0 pg   MCHC 32.7 30.0 - 36.0 g/dL   RDW 91.416.2 (H) 78.211.5 - 95.615.5 %   Platelets 147 (L) 150 - 400 K/uL   [place] Ob Comp Less 14 Wks  Result Date: 07/02/2016 CLINICAL DATA:  Abdominal pain affecting pregnancy ; EGA [redacted] weeks 2 days by LMP, quantitative beta HCG = 52,521 EXAM: OBSTETRIC <14 WK US AND TRANSVAGINAL OB US TECHNIQUE: Both transabdominal and transvaginal ultrasound examinations were performed for complete evaluation of the gestation as well as the maternal uterus, adnexal regions, and pelvic cul-de-sac. Transvaginal technique was performed to assess early pregnancy. COMPARISON:  None for this gestation FINDINGS: Intrauterine gestational sac: Present Yolk sac:  Present Embryo:  Present Cardiac Activity: Present Heart Rate: 130  bpm CRL:  8.9  mm   6 w   5 d                  US EDC: 02/20/2017 Subchorionic hemorrhage: Small subchronic hemorrhage 2.3 x 1.3 x 2.8 cm. Maternal uterus/adnexae: Uterus otherwise normal appearance. Ovaries normal size morphology with small corpus luteal cyst in LEFT ovary. Small amount of free pelvic fluid. No adnexal masses. IMPRESSION: Single live intrauterine gestation measured at 6 weeks 5 days EGA by crown-rump length. Small subchorionic hemorrhage as above. Small amount of nonspecific free pelvic fluid. Electronically Signed   By: Ulyses SouthwardMark  Boles M.D.   On: 07/02/2016 14:09   [place] Ob Transvaginal  Result Date: 07/02/2016 CLINICAL DATA:  Abdominal pain affecting pregnancy ; EGA [redacted] weeks 2 days by LMP, quantitative beta HCG = 52,521 EXAM: OBSTETRIC <14  WK US AND TRANSVAGINAL OB US TECHNIQUE: Both transabdominal and transvaginal ultrasound examinations were performed for complete evaluation of the gestation as well as the maternal uterus, adnexal regions, and pelvic cul-de-sac. Transvaginal technique was performed to assess early pregnancy. COMPARISON:  None for this gestation FINDINGS: Intrauterine gestational sac: Present Yolk sac:  Present Embryo:  Present Cardiac Activity: Present Heart Rate: 130  bpm CRL:  8.9  mm   6 w   5 d                  US EDC: 02/20/2017 Subchorionic hemorrhage: Small subchronic hemorrhage 2.3 x 1.3 x 2.8 cm.  Maternal uterus/adnexae: Uterus otherwise normal appearance. Ovaries normal size morphology with small corpus luteal cyst in LEFT ovary. Small amount of free pelvic fluid. No adnexal masses. IMPRESSION: Single live intrauterine gestation measured at 6 weeks 5 days EGA by crown-rump length. Small subchorionic hemorrhage as above. Small amount of nonspecific free pelvic fluid. Electronically Signed   By: Ulyses Southward M.D.   On: 07/02/2016 14:09     MAU Course  Procedures  MDM  UA  CBC Quant hCG HIV GC/C Cultures Wet Prep Ultrasound  Assessment and Plan   1. Viable Intrauterine Pregnancy 2. Corpus Luteal Cyst 3. Small Subchorionic hemorrhage  Discharge home in stable condition Reassured patient that pregnancy is viable and pain most likely related to CL cyst Bleeding precautions regarding subchorionic hemorrhage provided Return to MAU for emergencies  Dartha Lodge 07/02/2016, 3:01 PM

## 2016-07-03 LAB — HIV ANTIBODY (ROUTINE TESTING W REFLEX): HIV SCREEN 4TH GENERATION: NONREACTIVE

## 2016-07-05 LAB — GC/CHLAMYDIA PROBE AMP (~~LOC~~) NOT AT ARMC
CHLAMYDIA, DNA PROBE: NEGATIVE
Neisseria Gonorrhea: NEGATIVE

## 2016-07-28 ENCOUNTER — Other Ambulatory Visit (HOSPITAL_COMMUNITY)
Admission: RE | Admit: 2016-07-28 | Discharge: 2016-07-28 | Disposition: A | Discharge status: Home / Self Care | Payer: Medicaid Other | Source: Ambulatory Visit | Attending: Obstetrics and Gynecology | Admitting: Obstetrics and Gynecology

## 2016-07-28 ENCOUNTER — Ambulatory Visit (INDEPENDENT_AMBULATORY_CARE_PROVIDER_SITE_OTHER): Payer: Medicaid Other | Admitting: Obstetrics and Gynecology

## 2016-07-28 ENCOUNTER — Encounter: Payer: Self-pay | Admitting: Obstetrics and Gynecology

## 2016-07-28 DIAGNOSIS — Z3480 Encounter for supervision of other normal pregnancy, unspecified trimester: Secondary | ICD-10-CM | POA: Diagnosis not present

## 2016-07-28 DIAGNOSIS — Z23 Encounter for immunization: Secondary | ICD-10-CM

## 2016-07-28 DIAGNOSIS — Z3481 Encounter for supervision of other normal pregnancy, first trimester: Secondary | ICD-10-CM

## 2016-07-28 DIAGNOSIS — O34219 Maternal care for unspecified type scar from previous cesarean delivery: Secondary | ICD-10-CM

## 2016-07-28 DIAGNOSIS — Z113 Encounter for screening for infections with a predominantly sexual mode of transmission: Secondary | ICD-10-CM | POA: Insufficient documentation

## 2016-07-28 MED ORDER — PREPLUS 27-1 MG PO TABS: 1.0000 | ORAL_TABLET | Freq: Every day | ORAL | 13 refills | Status: AC

## 2016-07-28 NOTE — Progress Notes (Signed)
  Subjective:    Ariana Hammond is a G2P1001 3754w0d being seen today for her first obstetrical visit.  Her obstetrical history is significant for previous cesarean section secondary to failure to progress. Patient does intend to breast feed. Pregnancy history fully reviewed.  Patient reports nausea.  Vitals:   07/28/16 0844  BP: 100/69  Pulse: 75  Temp: 98.7 F (37.1 C)  Weight: 159 lb 14.4 oz (72.5 kg)    HISTORY: OB History  Gravida Para Term Preterm AB Living  2 1 1     1   SAB TAB Ectopic Multiple Live Births          1    # Outcome Date GA Lbr Len/2nd Weight Sex Delivery Anes PTL Lv  2 Current           1 Term     F CS-Unspec  N LIV     History reviewed. No pertinent past medical history. Past Surgical History:  Procedure Laterality Date  . CESAREAN SECTION     History reviewed. No pertinent family history.   Exam    Uterus:   12-weeks in size  Pelvic Exam:    Perineum: No Hemorrhoids, Normal Perineum   Vulva: normal   Vagina:  normal mucosa, normal discharge   pH:    Cervix: multiparous appearance   Adnexa: normal adnexa and no mass, fullness, tenderness   Bony Pelvis: gynecoid  System: Breast:  normal appearance, no masses or tenderness   Skin: normal coloration and turgor, no rashes    Neurologic: oriented, no focal deficits   Extremities: normal strength, tone, and muscle mass   HEENT extra ocular movement intact   Mouth/Teeth mucous membranes moist, pharynx normal without lesions and dental hygiene good   Neck supple and no masses   Cardiovascular: regular rate and rhythm   Respiratory:  chest clear, no wheezing, crepitations, rhonchi, normal symmetric air entry   Abdomen: soft, non-tender; bowel sounds normal; no masses,  no organomegaly   Urinary:       Assessment:    Pregnancy: G2P1001 Patient Active Problem List   Diagnosis Date Noted  . Supervision of other normal pregnancy, antepartum 07/28/2016  . Previous cesarean delivery,  antepartum 07/28/2016        Plan:     Initial labs drawn. Prenatal vitamins. Problem list reviewed and updated. Genetic Screening discussed First Screen: ordered.  Ultrasound discussed; fetal survey: requested. [facility] Will [facility] at [facility]  Follow up in 4 weeks. 50% of 30 min visit spent on counseling and coordination of care.     Takari Lundahl 07/28/2016

## 2016-07-28 NOTE — Addendum Note (Signed)
Addended by: Francene FindersJAMES, QUINETTA C on: 07/28/2016 11:19 AM   Modules accepted: Orders

## 2016-07-28 NOTE — Patient Instructions (Signed)
 First Trimester of Pregnancy The first trimester of pregnancy is from week 1 until the end of week 12 (months 1 through 3). A week after a sperm fertilizes an egg, the egg will implant on the wall of the uterus. This embryo will begin to develop into a baby. Genes from you and your partner are forming the baby. The female genes determine whether the baby is a boy or a girl. At 6-8 weeks, the eyes and face are formed, and the heartbeat can be seen on ultrasound. At the end of 12 weeks, all the baby's organs are formed.  Now that you are pregnant, you will want to do everything you can to have a healthy baby. Two of the most important things are to get good prenatal care and to follow your health care provider's instructions. Prenatal care is all the medical care you receive before the baby's birth. This care will help prevent, find, and treat any problems during the pregnancy and childbirth. BODY CHANGES Your body goes through many changes during pregnancy. The changes vary from woman to woman.   You may gain or lose a couple of pounds at first.  You may feel sick to your stomach (nauseous) and throw up (vomit). If the vomiting is uncontrollable, call your health care provider.  You may tire easily.  You may develop headaches that can be relieved by medicines approved by your health care provider.  You may urinate more often. Painful urination may mean you have a bladder infection.  You may develop heartburn as a result of your pregnancy.  You may develop constipation because certain hormones are causing the muscles that push waste through your intestines to slow down.  You may develop hemorrhoids or swollen, bulging veins (varicose veins).  Your breasts may begin to grow larger and become tender. Your nipples may stick out more, and the tissue that surrounds them (areola) may become darker.  Your gums may bleed and may be sensitive to brushing and flossing.  Dark spots or blotches  (chloasma, mask of pregnancy) may develop on your face. This will likely fade after the baby is born.  Your menstrual periods will stop.  You may have a loss of appetite.  You may develop cravings for certain kinds of food.  You may have changes in your emotions from day to day, such as being excited to be pregnant or being concerned that something may go wrong with the pregnancy and baby.  You may have more vivid and strange dreams.  You may have changes in your hair. These can include thickening of your hair, rapid growth, and changes in texture. Some women also have hair loss during or after pregnancy, or hair that feels dry or thin. Your hair will most likely return to normal after your baby is born. WHAT TO EXPECT AT YOUR PRENATAL VISITS During a routine prenatal visit:  You will be weighed to make sure you and the baby are growing normally.  Your blood pressure will be taken.  Your abdomen will be measured to track your baby's growth.  The fetal heartbeat will be listened to starting around week 10 or 12 of your pregnancy.  Test results from any previous visits will be discussed. Your health care provider may ask you:  How you are feeling.  If you are feeling the baby move.  If you have had any abnormal symptoms, such as leaking fluid, bleeding, severe headaches, or abdominal cramping.  If you are using any tobacco   products, including cigarettes, chewing tobacco, and electronic cigarettes.  If you have any questions. Other tests that may be performed during your first trimester include:  Blood tests to find your blood type and to check for the presence of any previous infections. They will also be used to check for low iron levels (anemia) and Rh antibodies. Later in the pregnancy, blood tests for diabetes will be done along with other tests if problems develop.  Urine tests to check for infections, diabetes, or protein in the urine.  An ultrasound to confirm the  proper growth and development of the baby.  An amniocentesis to check for possible genetic problems.  Fetal screens for spina bifida and Down syndrome.  You may need other tests to make sure you and the baby are doing well.  HIV (human immunodeficiency virus) testing. Routine prenatal testing includes screening for HIV, unless you choose not to have this test. HOME CARE INSTRUCTIONS  Medicines   Follow your health care provider's instructions regarding medicine use. Specific medicines may be either safe or unsafe to take during pregnancy.  Take your prenatal vitamins as directed.  If you develop constipation, try taking a stool softener if your health care provider approves. Diet   Eat regular, well-balanced meals. Choose a variety of foods, such as meat or vegetable-based protein, fish, milk and low-fat dairy products, vegetables, fruits, and whole grain breads and cereals. Your health care provider will help you determine the amount of weight gain that is right for you.  Avoid raw meat and uncooked cheese. These carry germs that can cause birth defects in the baby.  Eating four or five small meals rather than three large meals a day may help relieve nausea and vomiting. If you start to feel nauseous, eating a few soda crackers can be helpful. Drinking liquids between meals instead of during meals also seems to help nausea and vomiting.  If you develop constipation, eat more high-fiber foods, such as fresh vegetables or fruit and whole grains. Drink enough fluids to keep your urine clear or pale yellow. Activity and Exercise   Exercise only as directed by your health care provider. Exercising will help you:  Control your weight.  Stay in shape.  Be prepared for labor and delivery.  Experiencing pain or cramping in the lower abdomen or low back is a good sign that you should stop exercising. Check with your health care provider before continuing normal exercises.  Try to avoid  standing for long periods of time. Move your legs often if you must stand in one place for a long time.  Avoid heavy lifting.  Wear low-heeled shoes, and practice good posture.  You may continue to have sex unless your health care provider directs you otherwise. Relief of Pain or Discomfort   Wear a good support bra for breast tenderness.   Take warm sitz baths to soothe any pain or discomfort caused by hemorrhoids. Use hemorrhoid cream if your health care provider approves.   Rest with your legs elevated if you have leg cramps or low back pain.  If you develop varicose veins in your legs, wear support hose. Elevate your feet for 15 minutes, 3-4 times a day. Limit salt in your diet. Prenatal Care   Schedule your prenatal visits by the twelfth week of pregnancy. They are usually scheduled monthly at first, then more often in the last 2 months before delivery.  Write down your questions. Take them to your prenatal visits.  Keep all your   prenatal visits as directed by your health care provider. Safety   Wear your seat belt at all times when driving.  Make a list of emergency phone numbers, including numbers for family, friends, the hospital, and police and fire departments. General Tips   Ask your health care provider for a referral to a local prenatal education class. Begin classes no later than at the beginning of month 6 of your pregnancy.  Ask for help if you have counseling or nutritional needs during pregnancy. Your health care provider can offer advice or refer you to specialists for help with various needs.  Do not use hot tubs, steam rooms, or saunas.  Do not douche or use tampons or scented sanitary pads.  Do not cross your legs for long periods of time.  Avoid cat litter boxes and soil used by cats. These carry germs that can cause birth defects in the baby and possibly loss of the fetus by miscarriage or stillbirth.  Avoid all smoking, herbs, alcohol, and  medicines not prescribed by your health care provider. Chemicals in these affect the formation and growth of the baby.  Do not use any tobacco products, including cigarettes, chewing tobacco, and electronic cigarettes. If you need help quitting, ask your health care provider. You may receive counseling support and other resources to help you quit.  Schedule a dentist appointment. At home, brush your teeth with a soft toothbrush and be gentle when you floss. SEEK MEDICAL CARE IF:   You have dizziness.  You have mild pelvic cramps, pelvic pressure, or nagging pain in the abdominal area.  You have persistent nausea, vomiting, or diarrhea.  You have a bad smelling vaginal discharge.  You have pain with urination.  You notice increased swelling in your face, hands, legs, or ankles. SEEK IMMEDIATE MEDICAL CARE IF:   You have a fever.  You are leaking fluid from your vagina.  You have spotting or bleeding from your vagina.  You have severe abdominal cramping or pain.  You have rapid weight gain or loss.  You vomit blood or material that looks like coffee grounds.  You are exposed to German measles and have never had them.  You are exposed to fifth disease or chickenpox.  You develop a severe headache.  You have shortness of breath.  You have any kind of trauma, such as from a fall or a car accident. This information is not intended to replace advice given to you by your health care provider. Make sure you discuss any questions you have with your health care provider. Document Released: 06/08/2001 Document Revised: 07/05/2014 Document Reviewed: 04/24/2013 Elsevier Interactive Patient Education  2017 [employer]  Contraception Choices Contraception (birth control) is the use of any methods or devices to prevent pregnancy. Below are some methods to help avoid pregnancy. Hormonal methods  Contraceptive implant. This is a thin, plastic tube containing progesterone hormone. It  does not contain estrogen hormone. Your health care provider inserts the tube in the inner part of the upper arm. The tube can remain in place for up to 3 years. After 3 years, the implant must be removed. The implant prevents the ovaries from releasing an egg (ovulation), thickens the cervical mucus to prevent sperm from entering the uterus, and thins the lining of the inside of the uterus.  Progesterone-only injections. These injections are given every 3 months by your health care provider to prevent pregnancy. This synthetic progesterone hormone stops the ovaries from releasing eggs. It also thickens cervical   mucus and changes the uterine lining. This makes it harder for sperm to survive in the uterus.  Birth control pills. These pills contain estrogen and progesterone hormone. They work by preventing the ovaries from releasing eggs (ovulation). They also cause the cervical mucus to thicken, preventing the sperm from entering the uterus. Birth control pills are prescribed by a health care provider.Birth control pills can also be used to treat heavy periods.  Minipill. This type of birth control pill contains only the progesterone hormone. They are taken every day of each month and must be prescribed by your health care provider.  Birth control patch. The patch contains hormones similar to those in birth control pills. It must be changed once a week and is prescribed by a health care provider.  Vaginal ring. The ring contains hormones similar to those in birth control pills. It is left in the vagina for 3 weeks, removed for 1 week, and then a new one is put back in place. The patient must be comfortable inserting and removing the ring from the vagina.A health care provider's prescription is necessary.  Emergency contraception. Emergency contraceptives prevent pregnancy after unprotected sexual intercourse. This pill can be taken right after sex or up to 5 days after unprotected sex. It is most  effective the sooner you take the pills after having sexual intercourse. Most emergency contraceptive pills are available without a prescription. Check with your pharmacist. Do not use emergency contraception as your only form of birth control. Barrier methods  Female condom. This is a thin sheath (latex or rubber) that is worn over the penis during sexual intercourse. It can be used with spermicide to increase effectiveness.  Female condom. This is a soft, loose-fitting sheath that is put into the vagina before sexual intercourse.  Diaphragm. This is a soft, latex, dome-shaped barrier that must be fitted by a health care provider. It is inserted into the vagina, along with a spermicidal jelly. It is inserted before intercourse. The diaphragm should be left in the vagina for 6 to 8 hours after intercourse.  Cervical cap. This is a round, soft, latex or plastic cup that fits over the cervix and must be fitted by a health care provider. The cap can be left in place for up to 48 hours after intercourse.  Sponge. This is a soft, circular piece of polyurethane foam. The sponge has spermicide in it. It is inserted into the vagina after wetting it and before sexual intercourse.  Spermicides. These are chemicals that kill or block sperm from entering the cervix and uterus. They come in the form of creams, jellies, suppositories, foam, or tablets. They do not require a prescription. They are inserted into the vagina with an applicator before having sexual intercourse. The process must be repeated every time you have sexual intercourse. Intrauterine contraception  Intrauterine device (IUD). This is a T-shaped device that is put in a woman's uterus during a menstrual period to prevent pregnancy. There are 2 types:  Copper IUD. This type of IUD is wrapped in copper wire and is placed inside the uterus. Copper makes the uterus and fallopian tubes produce a fluid that kills sperm. It can stay in place for 10  years.  Hormone IUD. This type of IUD contains the hormone progestin (synthetic progesterone). The hormone thickens the cervical mucus and prevents sperm from entering the uterus, and it also thins the uterine lining to prevent implantation of a fertilized egg. The hormone can weaken or kill the sperm that   get into the uterus. It can stay in place for 3-5 years, depending on which type of IUD is used. Permanent methods of contraception  Female tubal ligation. This is when the woman's fallopian tubes are surgically sealed, tied, or blocked to prevent the egg from traveling to the uterus.  Hysteroscopic sterilization. This involves placing a small coil or insert into each fallopian tube. Your doctor uses a technique called hysteroscopy to do the procedure. The device causes scar tissue to form. This results in permanent blockage of the fallopian tubes, so the sperm cannot fertilize the egg. It takes about 3 months after the procedure for the tubes to become blocked. You must use another form of birth control for these 3 months.  Female sterilization. This is when the female has the tubes that carry sperm tied off (vasectomy).This blocks sperm from entering the vagina during sexual intercourse. After the procedure, the man can still ejaculate fluid (semen). Natural planning methods  Natural family planning. This is not having sexual intercourse or using a barrier method (condom, diaphragm, cervical cap) on days the woman could become pregnant.  Calendar method. This is keeping track of the length of each menstrual cycle and identifying when you are fertile.  Ovulation method. This is avoiding sexual intercourse during ovulation.  Symptothermal method. This is avoiding sexual intercourse during ovulation, using a thermometer and ovulation symptoms.  Post-ovulation method. This is timing sexual intercourse after you have ovulated. Regardless of which type or method of contraception you choose, it is  important that you use condoms to protect against the transmission of sexually transmitted infections (STIs). Talk with your health care provider about which form of contraception is most appropriate for you. This information is not intended to replace advice given to you by your health care provider. Make sure you discuss any questions you have with your health care provider. Document Released: 06/14/2005 Document Revised: 11/20/2015 Document Reviewed: 12/07/2012 Elsevier Interactive Patient Education  2017 [employer]   Breastfeeding Deciding to breastfeed is one of the best choices you can make for you and your baby. A change in hormones during pregnancy causes your breast tissue to grow and increases the number and size of your milk ducts. These hormones also allow proteins, sugars, and fats from your blood supply to make breast milk in your milk-producing glands. Hormones prevent breast milk from being released before your baby is born as well as prompt milk flow after birth. Once breastfeeding has begun, thoughts of your baby, as well as his or her sucking or crying, can stimulate the release of milk from your milk-producing glands. Benefits of breastfeeding For Your Baby  Your first milk (colostrum) helps your baby's digestive system function better.  There are antibodies in your milk that help your baby fight off infections.  Your baby has a lower incidence of asthma, allergies, and sudden infant death syndrome.  The nutrients in breast milk are better for your baby than infant formulas and are designed uniquely for your baby's needs.  Breast milk improves your baby's brain development.  Your baby is less likely to develop other conditions, such as childhood obesity, asthma, or type 2 diabetes mellitus. For You  Breastfeeding helps to create a very special bond between you and your baby.  Breastfeeding is convenient. Breast milk is always available at the correct temperature and  costs nothing.  Breastfeeding helps to burn calories and helps you lose the weight gained during pregnancy.  Breastfeeding makes your uterus contract to its   prepregnancy size faster and slows bleeding (lochia) after you give birth.  Breastfeeding helps to lower your risk of developing type 2 diabetes mellitus, osteoporosis, and breast or ovarian cancer later in life. Signs that your baby is hungry Early Signs of Hunger  Increased alertness or activity.  Stretching.  Movement of the head from side to side.  Movement of the head and opening of the mouth when the corner of the mouth or cheek is stroked (rooting).  Increased sucking sounds, smacking lips, cooing, sighing, or squeaking.  Hand-to-mouth movements.  Increased sucking of fingers or hands. Late Signs of Hunger  Fussing.  Intermittent crying. Extreme Signs of Hunger  Signs of extreme hunger will require calming and consoling before your baby will be able to breastfeed successfully. Do not wait for the following signs of extreme hunger to occur before you initiate breastfeeding:  Restlessness.  A loud, strong cry.  Screaming. Breastfeeding basics  Breastfeeding Initiation  Find a comfortable place to sit or lie down, with your neck and back well supported.  Place a pillow or rolled up blanket under your baby to bring him or her to the level of your breast (if you are seated). Nursing pillows are specially designed to help support your arms and your baby while you breastfeed.  Make sure that your baby's abdomen is facing your abdomen.  Gently massage your breast. With your fingertips, massage from your chest wall toward your nipple in a circular motion. This encourages milk flow. You may need to continue this action during the feeding if your milk flows slowly.  Support your breast with 4 fingers underneath and your thumb above your nipple. Make sure your fingers are well away from your nipple and your baby's  mouth.  Stroke your baby's lips gently with your finger or nipple.  When your baby's mouth is open wide enough, quickly bring your baby to your breast, placing your entire nipple and as much of the colored area around your nipple (areola) as possible into your baby's mouth.  More areola should be visible above your baby's upper lip than below the lower lip.  Your baby's tongue should be between his or her lower gum and your breast.  Ensure that your baby's mouth is correctly positioned around your nipple (latched). Your baby's lips should create a seal on your breast and be turned out (everted).  It is common for your baby to suck about 2-3 minutes in order to start the flow of breast milk. Latching  Teaching your baby how to latch on to your breast properly is very important. An improper latch can cause nipple pain and decreased milk supply for you and poor weight gain in your baby. Also, if your baby is not latched onto your nipple properly, he or she may swallow some air during feeding. This can make your baby fussy. Burping your baby when you switch breasts during the feeding can help to get rid of the air. However, teaching your baby to latch on properly is still the best way to prevent fussiness from swallowing air while breastfeeding. Signs that your baby has successfully latched on to your nipple:  Silent tugging or silent sucking, without causing you pain.  Swallowing heard between every 3-4 sucks.  Muscle movement above and in front of his or her ears while sucking. Signs that your baby has not successfully latched on to nipple:  Sucking sounds or smacking sounds from your baby while breastfeeding.  Nipple pain. If you think your   baby has not latched on correctly, slip your finger into the corner of your baby's mouth to break the suction and place it between your baby's gums. Attempt breastfeeding initiation again. Signs of Successful Breastfeeding  Signs from your baby:  A  gradual decrease in the number of sucks or complete cessation of sucking.  Falling asleep.  Relaxation of his or her body.  Retention of a small amount of milk in his or her mouth.  Letting go of your breast by himself or herself. Signs from you:  Breasts that have increased in firmness, weight, and size 1-3 hours after feeding.  Breasts that are softer immediately after breastfeeding.  Increased milk volume, as well as a change in milk consistency and color by the fifth day of breastfeeding.  Nipples that are not sore, cracked, or bleeding. Signs That Your Baby is Getting Enough Milk  Wetting at least 1-2 diapers during the first 24 hours after birth.  Wetting at least 5-6 diapers every 24 hours for the first week after birth. The urine should be clear or pale yellow by 5 days after birth.  Wetting 6-8 diapers every 24 hours as your baby continues to grow and develop.  At least 3 stools in a 24-hour period by age 5 days. The stool should be soft and yellow.  At least 3 stools in a 24-hour period by age 7 days. The stool should be seedy and yellow.  No loss of weight greater than 10% of birth weight during the first 3 days of age.  Average weight gain of 4-7 ounces (113-198 g) per week after age 4 days.  Consistent daily weight gain by age 5 days, without weight loss after the age of 2 weeks. After a feeding, your baby may spit up a small amount. This is common. Breastfeeding frequency and duration Frequent feeding will help you make more milk and can prevent sore nipples and breast engorgement. Breastfeed when you feel the need to reduce the fullness of your breasts or when your baby shows signs of hunger. This is called "breastfeeding on demand." Avoid introducing a pacifier to your baby while you are working to establish breastfeeding (the first 4-6 weeks after your baby is born). After this time you may choose to use a pacifier. Research has shown that pacifier use during the  first year of a baby's life decreases the risk of sudden infant death syndrome (SIDS). Allow your baby to feed on each breast as long as he or she wants. Breastfeed until your baby is finished feeding. When your baby unlatches or falls asleep while feeding from the first breast, offer the second breast. Because newborns are often sleepy in the first few weeks of life, you may need to awaken your baby to get him or her to feed. Breastfeeding times will vary from baby to baby. However, the following rules can serve as a guide to help you ensure that your baby is properly fed:  Newborns (babies 4 weeks of age or younger) may breastfeed every 1-3 hours.  Newborns should not go longer than 3 hours during the day or 5 hours during the night without breastfeeding.  You should breastfeed your baby a minimum of 8 times in a 24-hour period until you begin to introduce solid foods to your baby at around 6 months of age. Breast milk pumping Pumping and storing breast milk allows you to ensure that your baby is exclusively fed your breast milk, even at times when you are unable   to breastfeed. This is especially important if you are going back to work while you are still breastfeeding or when you are not able to be present during feedings. Your lactation consultant can give you guidelines on how long it is safe to store breast milk. A breast pump is a machine that allows you to pump milk from your breast into a sterile bottle. The pumped breast milk can then be stored in a refrigerator or freezer. Some breast pumps are operated by hand, while others use electricity. Ask your lactation consultant which type will work best for you. Breast pumps can be purchased, but some hospitals and breastfeeding support groups lease breast pumps on a monthly basis. A lactation consultant can teach you how to hand express breast milk, if you prefer not to use a pump. Caring for your breasts while you breastfeed Nipples can become  dry, cracked, and sore while breastfeeding. The following recommendations can help keep your breasts moisturized and healthy:  Avoid using soap on your nipples.  Wear a supportive bra. Although not required, special nursing bras and tank tops are designed to allow access to your breasts for breastfeeding without taking off your entire bra or top. Avoid wearing underwire-style bras or extremely tight bras.  Air dry your nipples for 3-4minutes after each feeding.  Use only cotton bra pads to absorb leaked breast milk. Leaking of breast milk between feedings is normal.  Use lanolin on your nipples after breastfeeding. Lanolin helps to maintain your skin's normal moisture barrier. If you use pure lanolin, you do not need to wash it off before feeding your baby again. Pure lanolin is not toxic to your baby. You may also hand express a few drops of breast milk and gently massage that milk into your nipples and allow the milk to air dry. In the first few weeks after giving birth, some women experience extremely full breasts (engorgement). Engorgement can make your breasts feel heavy, warm, and tender to the touch. Engorgement peaks within 3-5 days after you give birth. The following recommendations can help ease engorgement:  Completely empty your breasts while breastfeeding or pumping. You may want to start by applying warm, moist heat (in the shower or with warm water-soaked hand towels) just before feeding or pumping. This increases circulation and helps the milk flow. If your baby does not completely empty your breasts while breastfeeding, pump any extra milk after he or she is finished.  Wear a snug bra (nursing or regular) or tank top for 1-2 days to signal your body to slightly decrease milk production.  Apply ice packs to your breasts, unless this is too uncomfortable for you.  Make sure that your baby is latched on and positioned properly while breastfeeding. If engorgement persists after 48  hours of following these recommendations, contact your health care provider or a lactation consultant. Overall health care recommendations while breastfeeding  Eat healthy foods. Alternate between meals and snacks, eating 3 of each per day. Because what you eat affects your breast milk, some of the foods may make your baby more irritable than usual. Avoid eating these foods if you are sure that they are negatively affecting your baby.  Drink milk, fruit juice, and water to satisfy your thirst (about 10 glasses a day).  Rest often, relax, and continue to take your prenatal vitamins to prevent fatigue, stress, and anemia.  Continue breast self-awareness checks.  Avoid chewing and smoking tobacco. Chemicals from cigarettes that pass into breast milk and exposure to   secondhand smoke may harm your baby.  Avoid alcohol and drug use, including marijuana. Some medicines that may be harmful to your baby can pass through breast milk. It is important to ask your health care provider before taking any medicine, including all over-the-counter and prescription medicine as well as vitamin and herbal supplements. It is possible to become pregnant while breastfeeding. If birth control is desired, ask your health care provider about options that will be safe for your baby. Contact a health care provider if:  You feel like you want to stop breastfeeding or have become frustrated with breastfeeding.  You have painful breasts or nipples.  Your nipples are cracked or bleeding.  Your breasts are red, tender, or warm.  You have a swollen area on either breast.  You have a fever or chills.  You have nausea or vomiting.  You have drainage other than breast milk from your nipples.  Your breasts do not become full before feedings by the fifth day after you give birth.  You feel sad and depressed.  Your baby is too sleepy to eat well.  Your baby is having trouble sleeping.  Your baby is wetting less  than 3 diapers in a 24-hour period.  Your baby has less than 3 stools in a 24-hour period.  Your baby's skin or the white part of his or her eyes becomes yellow.  Your baby is not gaining weight by 5 days of age. Get help right away if:  Your baby is overly tired (lethargic) and does not want to wake up and feed.  Your baby develops an unexplained fever. This information is not intended to replace advice given to you by your health care provider. Make sure you discuss any questions you have with your health care provider. Document Released: 06/14/2005 Document Revised: 11/26/2015 Document Reviewed: 12/06/2012 Elsevier Interactive Patient Education  2017 [employer] Inc.  

## 2016-07-29 LAB — GC/CHLAMYDIA PROBE AMP (~~LOC~~) NOT AT ARMC
CHLAMYDIA, DNA PROBE: NEGATIVE
Neisseria Gonorrhea: NEGATIVE

## 2016-07-29 LAB — CYTOLOGY - PAP: Diagnosis: NEGATIVE

## 2016-07-31 LAB — URINE CULTURE, OB REFLEX

## 2016-07-31 LAB — CULTURE, OB URINE

## 2016-08-03 LAB — OBSTETRIC PANEL, INCLUDING HIV
ANTIBODY SCREEN: NEGATIVE
BASOS ABS: 0 10*3/uL (ref 0.0–0.2)
BASOS: 0 %
EOS (ABSOLUTE): 0.1 10*3/uL (ref 0.0–0.4)
Eos: 2 %
HIV SCREEN 4TH GENERATION: NONREACTIVE
Hematocrit: 34.2 % (ref 34.0–46.6)
Hemoglobin: 10.6 g/dL — ABNORMAL LOW (ref 11.1–15.9)
Hepatitis B Surface Ag: NEGATIVE
Immature Grans (Abs): 0 10*3/uL (ref 0.0–0.1)
Immature Granulocytes: 0 %
LYMPHS ABS: 2.3 10*3/uL (ref 0.7–3.1)
Lymphs: 38 %
MCH: 24.7 pg — AB (ref 26.6–33.0)
MCHC: 31 g/dL — AB (ref 31.5–35.7)
MCV: 80 fL (ref 79–97)
MONOCYTES: 8 %
MONOS ABS: 0.5 10*3/uL (ref 0.1–0.9)
NEUTROS ABS: 3.2 10*3/uL (ref 1.4–7.0)
Neutrophils: 52 %
PLATELETS: 151 10*3/uL (ref 150–379)
RBC: 4.3 x10E6/uL (ref 3.77–5.28)
RDW: 17.6 % — ABNORMAL HIGH (ref 12.3–15.4)
RPR Ser Ql: NONREACTIVE
Rh Factor: POSITIVE
Rubella Antibodies, IGG: 15.6 index (ref >0.99)
WBC: 6.1 10*3/uL (ref 3.4–10.8)

## 2016-08-03 LAB — HEMOGLOBINOPATHY EVALUATION
HGB A: 96.8 % (ref 96.4–98.8)
HGB C: 0 %
HGB S: 0 %
HGB VARIANT: 0 %
Hemoglobin A2 Quantitation: 2.3 % (ref 1.8–3.2)
Hemoglobin F Quantitation: 0.9 % (ref 0.0–2.0)

## 2016-08-03 LAB — CYSTIC FIBROSIS MUTATION 97: Interpretation: NOT DETECTED

## 2016-08-03 LAB — VARICELLA ZOSTER ANTIBODY, IGG: Varicella zoster IgG: 2036 index (ref >165)

## 2016-08-03 LAB — VITAMIN D 25 HYDROXY (VIT D DEFICIENCY, FRACTURES): Vit D, 25-Hydroxy: 10.7 ng/mL — ABNORMAL LOW (ref 30.0–100.0)

## 2016-08-04 LAB — TOXASSURE SELECT 13 (MW), URINE

## 2016-08-05 ENCOUNTER — Other Ambulatory Visit: Payer: Self-pay | Admitting: Obstetrics and Gynecology

## 2016-08-05 ENCOUNTER — Ambulatory Visit (HOSPITAL_COMMUNITY)
Admission: RE | Admit: 2016-08-05 | Discharge: 2016-08-05 | Disposition: A | Discharge status: Home / Self Care | Payer: Medicaid Other | Source: Ambulatory Visit | Attending: Obstetrics and Gynecology | Admitting: Obstetrics and Gynecology

## 2016-08-05 ENCOUNTER — Encounter (HOSPITAL_COMMUNITY): Payer: Self-pay

## 2016-08-05 DIAGNOSIS — O34219 Maternal care for unspecified type scar from previous cesarean delivery: Secondary | ICD-10-CM

## 2016-08-05 DIAGNOSIS — Z3480 Encounter for supervision of other normal pregnancy, unspecified trimester: Secondary | ICD-10-CM

## 2016-08-05 DIAGNOSIS — O34211 Maternal care for low transverse scar from previous cesarean delivery: Secondary | ICD-10-CM | POA: Insufficient documentation

## 2016-08-05 DIAGNOSIS — Z3A12 12 weeks gestation of pregnancy: Secondary | ICD-10-CM | POA: Insufficient documentation

## 2016-08-05 DIAGNOSIS — Z3682 Encounter for antenatal screening for nuchal translucency: Secondary | ICD-10-CM

## 2016-08-10 ENCOUNTER — Other Ambulatory Visit: Payer: Self-pay

## 2016-08-12 ENCOUNTER — Encounter: Payer: Self-pay | Admitting: *Deleted

## 2016-08-26 ENCOUNTER — Encounter: Payer: Medicaid Other | Admitting: Certified Nurse Midwife

## 2016-09-06 ENCOUNTER — Encounter: Payer: Medicaid Other | Admitting: Certified Nurse Midwife

## 2016-09-14 ENCOUNTER — Ambulatory Visit (INDEPENDENT_AMBULATORY_CARE_PROVIDER_SITE_OTHER): Payer: Medicaid Other | Admitting: Certified Nurse Midwife

## 2016-09-14 ENCOUNTER — Encounter: Payer: Self-pay | Admitting: Certified Nurse Midwife

## 2016-09-14 VITALS — BP 114/77 | HR 93 | Wt 159.0 lb

## 2016-09-14 DIAGNOSIS — Z3482 Encounter for supervision of other normal pregnancy, second trimester: Secondary | ICD-10-CM

## 2016-09-14 DIAGNOSIS — R7989 Other specified abnormal findings of blood chemistry: Secondary | ICD-10-CM | POA: Insufficient documentation

## 2016-09-14 DIAGNOSIS — Z3480 Encounter for supervision of other normal pregnancy, unspecified trimester: Secondary | ICD-10-CM

## 2016-09-14 MED ORDER — VITAMIN D (ERGOCALCIFEROL) 1.25 MG (50000 UNIT) PO CAPS: 50000.0000 [IU] | ORAL_CAPSULE | ORAL | 2 refills | Status: AC

## 2016-09-14 NOTE — Progress Notes (Signed)
Patient reports feeling fetal movement and complains of stomach pains, denies contractions and bleeding.

## 2016-09-14 NOTE — Progress Notes (Addendum)
   PRENATAL VISIT NOTE  Subjective:  Ariana Hammond is a 26 y.o. G2P1001 at 5336w6d being seen today for ongoing prenatal care.  She is currently monitored for the following issues for this low-risk pregnancy and has Supervision of other normal pregnancy, antepartum; Previous cesarean delivery, antepartum; and Low vitamin D level on her problem list.  Patient reports no complaints.  Contractions: Not present. Vag. Bleeding: None.  Movement: Present. Denies leaking of fluid.   The following portions of the patient's history were reviewed and updated as appropriate: allergies, current medications, past family history, past medical history, past social history, past surgical history and problem list. Problem list updated.  Objective:   Vitals:   09/14/16 1049  BP: 114/77  Pulse: 93  Weight: 159 lb (72.1 kg)    Fetal Status: Fetal Heart Rate (bpm): 135 Fundal Height: 17 cm Movement: Present     General:  Alert, oriented and cooperative. Patient is in no acute distress.  Skin: Skin is warm and dry. No rash noted.   Cardiovascular: Normal heart rate noted  Respiratory: Normal respiratory effort, no problems with respiration noted  Abdomen: Soft, gravid, appropriate for gestational age. Pain/Pressure: Present     Pelvic:  Cervical exam deferred        Extremities: Normal range of motion.  Edema: None  Mental Status: Normal mood and affect. Normal behavior. Normal judgment and thought content.   Assessment and Plan:  Pregnancy: G2P1001 at 7136w6d  1. Supervision of other normal pregnancy, antepartum    Doing well.  Round ligament pain/lumbardosis of pregnancy: Rx written abdominal maternity support belt.    - AFP, Serum, Open Spina Bifida - US MFM OB COMP + 14 WK; Future  2. Low vitamin D level     - Vitamin D, Ergocalciferol, (DRISDOL) 50000 units CAPS capsule; Take 1 capsule (50,000 Units total) by mouth every 7 (seven) days.  Dispense: 30 capsule; Refill: 2  Preterm labor symptoms  and general obstetric precautions including but not limited to vaginal bleeding, contractions, leaking of fluid and fetal movement were reviewed in detail with the patient. Please refer to After Visit Summary for other counseling recommendations.  Return in about 4 weeks (around 10/12/2016) for ROB.   Roe Coombsachelle A Cutberto Winfree, CNM

## 2016-09-20 ENCOUNTER — Other Ambulatory Visit: Payer: Self-pay | Admitting: Certified Nurse Midwife

## 2016-09-20 DIAGNOSIS — O26899 Other specified pregnancy related conditions, unspecified trimester: Secondary | ICD-10-CM | POA: Insufficient documentation

## 2016-09-20 DIAGNOSIS — R102 Pelvic and perineal pain: Principal | ICD-10-CM

## 2016-09-20 NOTE — Progress Notes (Signed)
Rx abdominal support belt given for round ligament pain on 09/14/16.

## 2016-09-23 ENCOUNTER — Ambulatory Visit (HOSPITAL_COMMUNITY): Admission: RE | Admit: 2016-09-23 | Payer: Medicaid Other | Source: Ambulatory Visit

## 2016-09-23 ENCOUNTER — Encounter: Payer: Self-pay | Admitting: *Deleted

## 2016-09-23 ENCOUNTER — Other Ambulatory Visit: Payer: Self-pay | Admitting: Certified Nurse Midwife

## 2016-09-23 ENCOUNTER — Ambulatory Visit (HOSPITAL_COMMUNITY)
Admission: RE | Admit: 2016-09-23 | Discharge: 2016-09-23 | Disposition: A | Discharge status: Home / Self Care | Payer: Medicaid Other | Source: Ambulatory Visit | Attending: Certified Nurse Midwife | Admitting: Certified Nurse Midwife

## 2016-09-23 DIAGNOSIS — Z3480 Encounter for supervision of other normal pregnancy, unspecified trimester: Secondary | ICD-10-CM

## 2016-09-23 DIAGNOSIS — Z363 Encounter for antenatal screening for malformations: Secondary | ICD-10-CM | POA: Diagnosis not present

## 2016-09-23 DIAGNOSIS — Z3A19 19 weeks gestation of pregnancy: Secondary | ICD-10-CM | POA: Insufficient documentation

## 2016-09-23 LAB — AFP, SERUM, OPEN SPINA BIFIDA
AFP MoM: 1.15
AFP VALUE AFPOSL: 49.3 ng/mL
Gest. Age on Collection Date: 17.6 weeks
Maternal Age At EDD: 24.9 years
OSBR Risk 1 IN: 10000
Test Results:: NEGATIVE
Weight: 159 [lb_av]

## 2016-09-29 ENCOUNTER — Encounter: Payer: Self-pay | Admitting: *Deleted

## 2016-10-12 ENCOUNTER — Ambulatory Visit (INDEPENDENT_AMBULATORY_CARE_PROVIDER_SITE_OTHER): Payer: Medicaid Other | Admitting: Certified Nurse Midwife

## 2016-10-12 ENCOUNTER — Encounter: Payer: Self-pay | Admitting: Certified Nurse Midwife

## 2016-10-12 VITALS — BP 116/68 | HR 101 | Wt 159.9 lb

## 2016-10-12 DIAGNOSIS — Z3482 Encounter for supervision of other normal pregnancy, second trimester: Secondary | ICD-10-CM

## 2016-10-12 DIAGNOSIS — E559 Vitamin D deficiency, unspecified: Secondary | ICD-10-CM

## 2016-10-12 DIAGNOSIS — O34219 Maternal care for unspecified type scar from previous cesarean delivery: Secondary | ICD-10-CM

## 2016-10-12 DIAGNOSIS — R102 Pelvic and perineal pain: Secondary | ICD-10-CM

## 2016-10-12 DIAGNOSIS — R7989 Other specified abnormal findings of blood chemistry: Secondary | ICD-10-CM

## 2016-10-12 DIAGNOSIS — Z3480 Encounter for supervision of other normal pregnancy, unspecified trimester: Secondary | ICD-10-CM

## 2016-10-12 DIAGNOSIS — O26899 Other specified pregnancy related conditions, unspecified trimester: Secondary | ICD-10-CM

## 2016-10-12 NOTE — Progress Notes (Signed)
   PRENATAL VISIT NOTE  Subjective:  Ariana Hammond is a 26 y.o. G2P1001 at [redacted]w[redacted]d being seen today for ongoing prenatal care.  She is currently monitored for the following issues for this low-risk pregnancy and has Supervision of other normal pregnancy, antepartum; Previous cesarean delivery, antepartum; Low vitamin D level; and Pain of round ligament affecting pregnancy, antepartum on her problem list.  Patient reports no complaints.  Contractions: Not present. Vag. Bleeding: None.  Movement: Present. Denies leaking of fluid.   The following portions of the patient's history were reviewed and updated as appropriate: allergies, current medications, past family history, past medical history, past social history, past surgical history and problem list. Problem list updated.  Objective:   Vitals:   10/12/16 1108  BP: 116/68  Pulse: (!) 101  Weight: 159 lb 14.4 oz (72.5 kg)    Fetal Status: Fetal Heart Rate (bpm): 131 Fundal Height: 22 cm Movement: Present     General:  Alert, oriented and cooperative. Patient is in no acute distress.  Skin: Skin is warm and dry. No rash noted.   Cardiovascular: Normal heart rate noted  Respiratory: Normal respiratory effort, no problems with respiration noted  Abdomen: Soft, gravid, appropriate for gestational age. Pain/Pressure: Absent     Pelvic:  Cervical exam deferred        Extremities: Normal range of motion.  Edema: None  Mental Status: Normal mood and affect. Normal behavior. Normal judgment and thought content.   Assessment and Plan:  Pregnancy: G2P1001 at [redacted]w[redacted]d  1. Low vitamin D level     Taking weekly vitamin D  2. Previous cesarean delivery, antepartum      Need previous records.  3. Supervision of other normal pregnancy, antepartum      Doing well  4. Pain of round ligament affecting pregnancy, antepartum     States that maternity support belt is working.   Preterm labor symptoms and general obstetric precautions including but  not limited to vaginal bleeding, contractions, leaking of fluid and fetal movement were reviewed in detail with the patient. Please refer to After Visit Summary for other counseling recommendations.  Return in about 4 weeks (around 11/09/2016) for ROB.   Roe Coombs, CNM

## 2016-10-12 NOTE — Progress Notes (Signed)
Patient will be moving back to [place] next month approximately  May 17th. Release of Information Medical Records signed today.

## 2016-10-21 ENCOUNTER — Encounter: Payer: Medicaid Other | Admitting: Certified Nurse Midwife

## 2016-11-09 ENCOUNTER — Ambulatory Visit (INDEPENDENT_AMBULATORY_CARE_PROVIDER_SITE_OTHER): Payer: Medicaid Other | Admitting: Certified Nurse Midwife

## 2016-11-09 ENCOUNTER — Encounter: Payer: Self-pay | Admitting: Certified Nurse Midwife

## 2016-11-09 VITALS — BP 105/66 | HR 90 | Wt 159.0 lb

## 2016-11-09 DIAGNOSIS — E559 Vitamin D deficiency, unspecified: Secondary | ICD-10-CM

## 2016-11-09 DIAGNOSIS — O34219 Maternal care for unspecified type scar from previous cesarean delivery: Secondary | ICD-10-CM

## 2016-11-09 DIAGNOSIS — Z3482 Encounter for supervision of other normal pregnancy, second trimester: Secondary | ICD-10-CM | POA: Diagnosis not present

## 2016-11-09 DIAGNOSIS — O26899 Other specified pregnancy related conditions, unspecified trimester: Secondary | ICD-10-CM

## 2016-11-09 DIAGNOSIS — R102 Pelvic and perineal pain: Secondary | ICD-10-CM

## 2016-11-09 DIAGNOSIS — Z3480 Encounter for supervision of other normal pregnancy, unspecified trimester: Secondary | ICD-10-CM

## 2016-11-09 DIAGNOSIS — O26892 Other specified pregnancy related conditions, second trimester: Secondary | ICD-10-CM | POA: Diagnosis not present

## 2016-11-09 DIAGNOSIS — R7989 Other specified abnormal findings of blood chemistry: Secondary | ICD-10-CM

## 2016-11-09 NOTE — Progress Notes (Signed)
   PRENATAL VISIT NOTE  Subjective:  Page SpiroDuchesse Brannick is a 26 y.o. G2P1001 at 2912w6d being seen today for ongoing prenatal care.  She is currently monitored for the following issues for this low-risk pregnancy and has Supervision of other normal pregnancy, antepartum; Previous cesarean delivery, antepartum; Low vitamin D level; and Pain of round ligament affecting pregnancy, antepartum on her problem list.  Patient reports no complaints.  Contractions: Not present. Vag. Bleeding: None.  Movement: Present. Denies leaking of fluid.   The following portions of the patient's history were reviewed and updated as appropriate: allergies, current medications, past family history, past medical history, past social history, past surgical history and problem list. Problem list updated.  Objective:   Vitals:   11/09/16 1047  BP: 105/66  Pulse: 90  Weight: 159 lb (72.1 kg)    Fetal Status: Fetal Heart Rate (bpm): 138 Fundal Height: 26 cm Movement: Present   FHT,138  General:  Alert, oriented and cooperative. Patient is in no acute distress.  Skin: Skin is warm and dry. No rash noted.   Cardiovascular: Normal heart rate noted  Respiratory: Normal respiratory effort, no problems with respiration noted  Abdomen: Soft, gravid, appropriate for gestational age. Pain/Pressure: Absent     Pelvic:  Cervical exam deferred        Extremities: Normal range of motion.  Edema: Trace  Mental Status: Normal mood and affect. Normal behavior. Normal judgment and thought content.   Assessment and Plan:  Pregnancy: G2P1001 at 312w6d  1. Supervision of other normal pregnancy, antepartum Patient moving to [place] this weekend,records release signed,records given to patient,patient unaware of where she will continue pnc at this time. Instructed to continue care asap when she reached [place].  2. Previous cesarean delivery, antepartum F/up with new provider upon moving,no previous cesarean records on file here.  3. Low  vitamin D level Continue taking weekly vit D .  4. Pain of round ligament affecting pregnancy, antepartum Reports pregnancy belt is working. Preterm labor symptoms and general obstetric precautions including but not limited to vaginal bleeding, contractions, leaking of fluid and fetal movement were reviewed in detail with the patient. Please refer to After Visit Summary for other counseling recommendations.  [place] does not have a provider at this time,r release signed,records given to patient  Roe Coombsenney, Rachelle A, CNM

## 2016-11-09 NOTE — Progress Notes (Signed)
Patient reports good fetal movement, denies pain. 

## 2017-01-04 ENCOUNTER — Encounter: Payer: Self-pay | Admitting: Emergency Medicine

## 2017-01-04 ENCOUNTER — Emergency Department: Payer: Auto Insurance (includes no fault)

## 2017-01-04 ENCOUNTER — Observation Stay
Admission: AD | Admit: 2017-01-04 | Discharge: 2017-01-04 | Discharge status: Against Medical Advice | Payer: Medicaid Other | Attending: Obstetrics and Gynecology | Admitting: Obstetrics and Gynecology

## 2017-01-04 ENCOUNTER — Emergency Department
Admission: EM | Admit: 2017-01-04 | Discharge: 2017-01-04 | Disposition: A | Discharge status: Other Acute Inpatient Hospital | Payer: Auto Insurance (includes no fault) | Source: Ambulatory Visit | Attending: Emergency Medicine | Admitting: Emergency Medicine

## 2017-01-04 DIAGNOSIS — Z98891 History of uterine scar from previous surgery: Secondary | ICD-10-CM

## 2017-01-04 DIAGNOSIS — Z041 Encounter for examination and observation following transport accident: Secondary | ICD-10-CM | POA: Insufficient documentation

## 2017-01-04 DIAGNOSIS — S199XXA Unspecified injury of neck, initial encounter: Secondary | ICD-10-CM

## 2017-01-04 DIAGNOSIS — M25551 Pain in right hip: Secondary | ICD-10-CM | POA: Insufficient documentation

## 2017-01-04 DIAGNOSIS — S3993XA Unspecified injury of pelvis, initial encounter: Secondary | ICD-10-CM

## 2017-01-04 DIAGNOSIS — O26893 Other specified pregnancy related conditions, third trimester: Principal | ICD-10-CM | POA: Insufficient documentation

## 2017-01-04 DIAGNOSIS — Y9289 Other specified places as the place of occurrence of the external cause: Secondary | ICD-10-CM | POA: Insufficient documentation

## 2017-01-04 DIAGNOSIS — D696 Thrombocytopenia, unspecified: Secondary | ICD-10-CM

## 2017-01-04 DIAGNOSIS — Z3A33 33 weeks gestation of pregnancy: Secondary | ICD-10-CM | POA: Insufficient documentation

## 2017-01-04 DIAGNOSIS — Z3A23 23 weeks gestation of pregnancy: Secondary | ICD-10-CM

## 2017-01-04 DIAGNOSIS — Y998 Other external cause status: Secondary | ICD-10-CM | POA: Insufficient documentation

## 2017-01-04 DIAGNOSIS — Y9389 Activity, other specified: Secondary | ICD-10-CM | POA: Insufficient documentation

## 2017-01-04 DIAGNOSIS — S0990XA Unspecified injury of head, initial encounter: Secondary | ICD-10-CM

## 2017-01-04 DIAGNOSIS — S8981XA Other specified injuries of right lower leg, initial encounter: Secondary | ICD-10-CM

## 2017-01-04 DIAGNOSIS — M25561 Pain in right knee: Secondary | ICD-10-CM | POA: Insufficient documentation

## 2017-01-04 DIAGNOSIS — O9A213 Injury, poisoning and certain other consequences of external causes complicating pregnancy, third trimester: Secondary | ICD-10-CM | POA: Insufficient documentation

## 2017-01-04 DIAGNOSIS — R109 Unspecified abdominal pain: Secondary | ICD-10-CM | POA: Insufficient documentation

## 2017-01-04 DIAGNOSIS — O99119 Other diseases of the blood and blood-forming organs and certain disorders involving the immune mechanism complicating pregnancy, unspecified trimester: Secondary | ICD-10-CM

## 2017-01-04 DIAGNOSIS — O9989 Other specified diseases and conditions complicating pregnancy, childbirth and the puerperium: Secondary | ICD-10-CM

## 2017-01-04 HISTORY — DX: Nonspecific reaction to tuberculin skin test without active tuberculosis: R76.11

## 2017-01-04 LAB — CBC AND DIFFERENTIAL
Baso # K/uL: 0 10*3/uL (ref 0.0–0.1)
Basophil %: 0.1 %
Eos # K/uL: 0.1 10*3/uL (ref 0.0–0.4)
Eosinophil %: 1.1 %
Hematocrit: 32 % — ABNORMAL LOW (ref 34–45)
Hemoglobin: 10 g/dL — ABNORMAL LOW (ref 11.2–15.7)
IMM Granulocytes #: 0.1 10*3/uL (ref 0.0–0.1)
IMM Granulocytes: 0.7 %
Lymph # K/uL: 2.5 10*3/uL (ref 1.2–3.7)
Lymphocyte %: 27 %
MCH: 28 pg/cell (ref 26–32)
MCHC: 32 g/dL (ref 32–36)
MCV: 88 fL (ref 79–95)
Mono # K/uL: 0.7 10*3/uL (ref 0.2–0.9)
Monocyte %: 7.5 %
Neut # K/uL: 5.9 10*3/uL (ref 1.6–6.1)
Nucl RBC # K/uL: 0 10*3/uL (ref 0.0–0.0)
Nucl RBC %: 0 /100 WBC (ref 0.0–0.2)
Platelets: 102 10*3/uL — ABNORMAL LOW (ref 160–370)
RBC: 3.6 MIL/uL — ABNORMAL LOW (ref 3.9–5.2)
RDW: 14.9 % — ABNORMAL HIGH (ref 11.7–14.4)
Seg Neut %: 63.6 %
WBC: 9.2 10*3/uL (ref 4.0–10.0)

## 2017-01-04 LAB — RUQ PANEL (ED ONLY)
ALT: 9 U/L (ref 0–35)
AST: 17 U/L (ref 0–35)
Albumin: 3.9 g/dL (ref 3.5–5.2)
Alk Phos: 89 U/L (ref 35–105)
Amylase: 91 U/L (ref 28–100)
Bilirubin,Direct: <0.2 mg/dL (ref 0.0–0.3)
Bilirubin,Total: 0.2 mg/dL (ref 0.0–1.2)
Lipase: 21 U/L (ref 13–60)
Total Protein: 6.9 g/dL (ref 6.3–7.7)

## 2017-01-04 LAB — PLASMA PROF 7 (ED ONLY)
Anion Gap,PL: 16 (ref 7–16)
CO2,Plasma: 18 mmol/L — ABNORMAL LOW (ref 20–28)
Chloride,Plasma: 103 mmol/L (ref 96–108)
Creatinine: 0.37 mg/dL — ABNORMAL LOW (ref 0.51–0.95)
GFR,Black: 172 *
GFR,Caucasian: 149 *
Glucose,Plasma: 75 mg/dL (ref 60–99)
Potassium,Plasma: 3.5 mmol/L (ref 3.4–4.7)
Sodium,Plasma: 137 mmol/L (ref 133–145)
UN,Plasma: 4 mg/dL — ABNORMAL LOW (ref 6–20)

## 2017-01-04 LAB — HOLD GRAY

## 2017-01-04 LAB — TYPE AND SCREEN
ABO RH Blood Type: O POS
Antibody Screen: NEGATIVE

## 2017-01-04 LAB — HOLD BLUE

## 2017-01-04 MED ORDER — ONDANSETRON HCL 2 MG/ML IV SOLN *I*
4.0000 mg | Freq: Once | INTRAMUSCULAR | Status: AC
Start: 2017-01-04 — End: 2017-01-04
  Administered 2017-01-04: 4 mg via INTRAVENOUS
  Filled 2017-01-04: qty 2

## 2017-01-04 MED ORDER — ACETAMINOPHEN 325 MG PO TABS *I*
650.0000 mg | ORAL_TABLET | Freq: Once | ORAL | Status: AC
Start: 2017-01-04 — End: 2017-01-04
  Administered 2017-01-04: 650 mg via ORAL
  Filled 2017-01-04: qty 2
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
## 2017-01-04 NOTE — ED Notes (Signed)
OB remains at bedside with fetal monitoring.

## 2017-01-04 NOTE — OB Triage Note (Addendum)
6:20 PM Arrived in ED to see pt. EFM applied, FHT 135 with moderate variability and accels. Dr.Blackmon aware. Pt to imaging at this time, CEFM off. Pamala HurryAbigail K Broxton Broady, RN  7:35 PM Pt returned from imaging and CEFM reapplied at 1854. Pt with reactive NST. Dr.Blackman aware, awaiting clearance from ED for pt to be sent to OB. Pamala HurryAbigail K Clydell Alberts, RN

## 2017-01-04 NOTE — Progress Notes (Signed)
10:25 PM Pt desires to leave AMA r/t childcare issues. Dr.Hanks discussed AMA paperwork with pt. IV removed. Pamala HurryAbigail K Tanisia Yokley, RN
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
## 2017-01-04 NOTE — ED Notes (Signed)
Patient able to stand and walk without assistance.  [facility]
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
## 2017-01-04 NOTE — ED Notes (Signed)
OB RN's at bedside for eval

## 2017-01-05 ENCOUNTER — Encounter: Payer: Self-pay | Admitting: Obstetrics & Gynecology

## 2017-03-19 ENCOUNTER — Observation Stay
Admission: AD | Admit: 2017-03-19 | Payer: Medicaid Other | Source: Ambulatory Visit | Admitting: Obstetrics and Gynecology

## 2017-05-11 ENCOUNTER — Encounter (HOSPITAL_COMMUNITY): Payer: Self-pay

## 2017-05-24 LAB — UNMAPPED LAB RESULTS
Hematocrit (HT): 36.1 % — NL (ref 34.0–47.0)
Hemoglobin (HGB) (HT): 11.6 g/dL — NL (ref 11.5–16.0)
MCHC (HT): 32.1 g/dL — NL (ref 32.0–36.0)
MCV (HT): 81.7 FL — NL (ref 81.0–99.0)
Mean Corpuscular Hemoglobin (MCH) (HT): 26.2 pg — NL (ref 26.0–34.0)
Platelets (HT): 157 10 3/uL — NL (ref 140–400)
RBC (HT): 4.42 10 6/uL — NL (ref 3.80–5.20)
RDW (HT): 16.3 % — ABNORMAL HIGH (ref 11.5–15.0)
WBC (HT): 6.2 10 3/uL — NL (ref 4.0–10.8)

## 2017-09-26 LAB — UNMAPPED LAB RESULTS
Basophil # (HT): 0 10 3/uL — NL (ref 0.0–0.2)
Basophil % (HT): 0 % — NL (ref 0.0–1.8)
Eosinophil # (HT): 0.1 10 3/uL — NL (ref 0.0–0.5)
Eosinophil % (HT): 1.8 % — NL (ref 0.0–7.0)
Hematocrit (HT): 35.7 % — NL (ref 34.0–47.0)
Hemoglobin (HGB) (HT): 11.6 g/dL — NL (ref 11.5–16.0)
Lymphocyte # (HT): 3.8 10 3/uL — NL (ref 0.9–3.8)
Lymphocyte % (HT): 51.7 % — ABNORMAL HIGH (ref 17.0–44.0)
MCHC (HT): 32.5 g/dL — NL (ref 32.0–36.0)
MCV (HT): 83.8 FL — NL (ref 81.0–99.0)
Mean Corpuscular Hemoglobin (MCH) (HT): 27.2 pg — NL (ref 26.0–34.0)
Monocyte # (HT): 0.4 10 3/uL — NL (ref 0.2–1.0)
Monocyte % (HT): 5.9 % — NL (ref 4.0–12.0)
Neutrophil # (HT): 3 10 3/uL — NL (ref 1.5–7.7)
Platelets (HT): 146 10 3/uL — NL (ref 140–400)
RBC (HT): 4.26 10 6/uL — NL (ref 3.80–5.20)
RDW (HT): 14.3 % — NL (ref 11.5–15.0)
Seg Neut % (HT): 40.6 % — NL (ref 40.0–75.0)
WBC (HT): 7.3 10 3/uL — NL (ref 4.0–10.8)

## 2018-06-28 ENCOUNTER — Emergency Department
Admission: EM | Admit: 2018-06-28 | Discharge: 2018-06-28 | Discharge status: Against Medical Advice | Payer: Medicaid Other | Source: Ambulatory Visit

## 2018-06-28 LAB — RSV PCR

## 2018-06-28 LAB — INFLUENZA A

## 2018-06-28 LAB — INFLUENZA B PCR

## 2018-06-28 NOTE — ED Notes (Signed)
Pt not visualized in WR.

## 2018-06-28 NOTE — ED Notes (Signed)
Pt called 3x with no return.

## 2018-06-28 NOTE — ED Triage Notes (Signed)
Reports 3 days of fever,HA, and general body aches. Mask on patient.        Triage Note   Roanna Epley, RN

## 2018-06-30 IMAGING — US US OB COMP LESS 14 WK
1 series · 15 of 28 positions shown · non-contrast
Comparison: None for this gestation

CLINICAL DATA: Abdominal pain affecting pregnancy ; EGA 7 weeks 2
days by LMP, quantitative beta HCG = 52,521

EXAM:
OBSTETRIC <14 WK US AND TRANSVAGINAL OB US
TECHNIQUE: Both transabdominal and transvaginal ultrasound examinations were
performed for complete evaluation of the gestation as well as the
maternal uterus, adnexal regions, and pelvic cul-de-sac.
Transvaginal technique was performed to assess early pregnancy.

[Series 1: us ob comp less 14 wk · 15 of 85 slices shown]
[im 1/85]
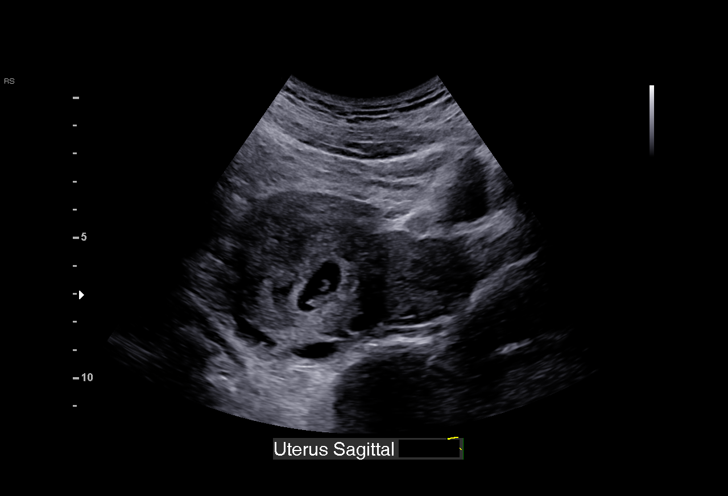
[im 7/85]
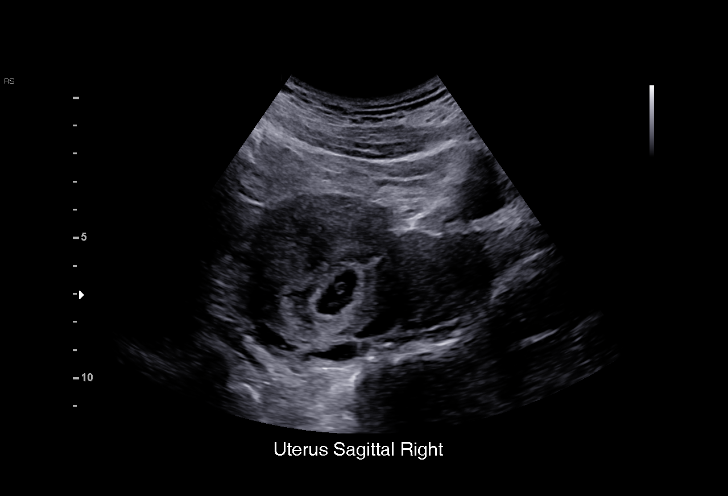
[im 13/85]
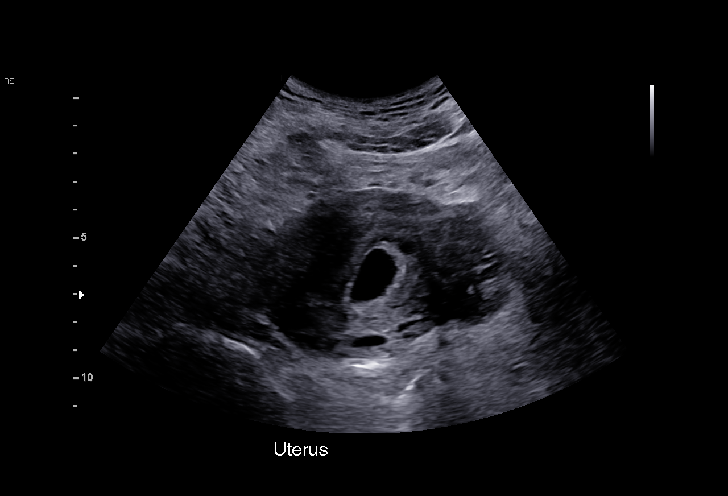
[im 19/85]
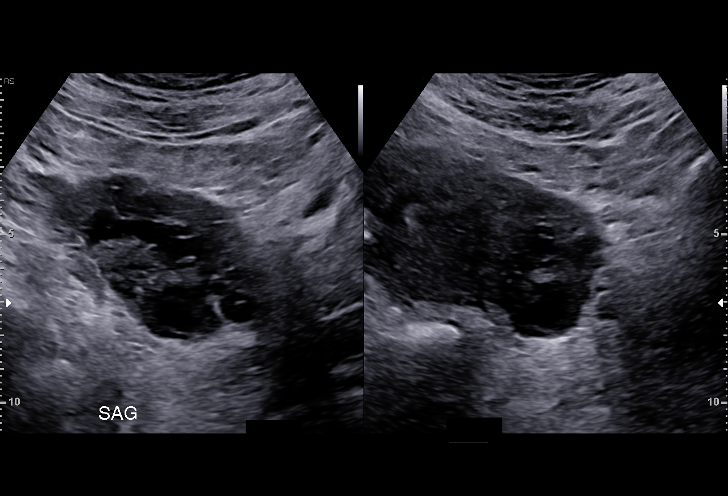
[im 25/85]
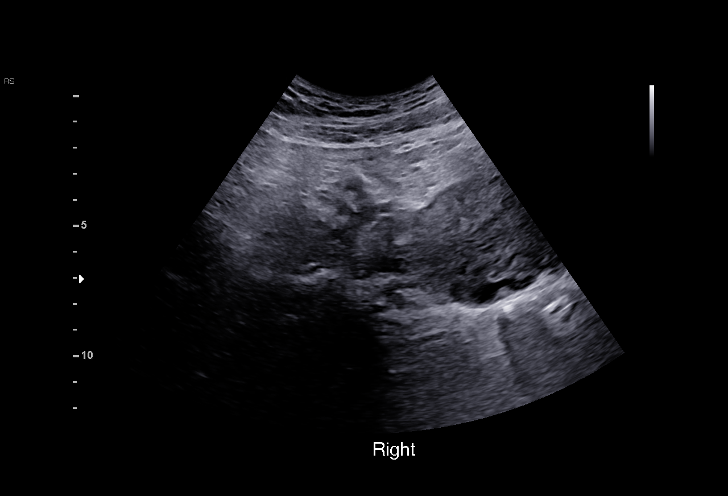
[im 32/85]
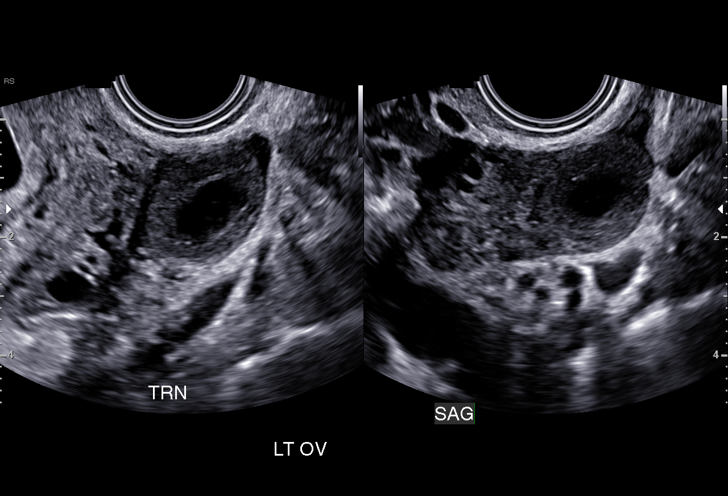
[im 38/85]
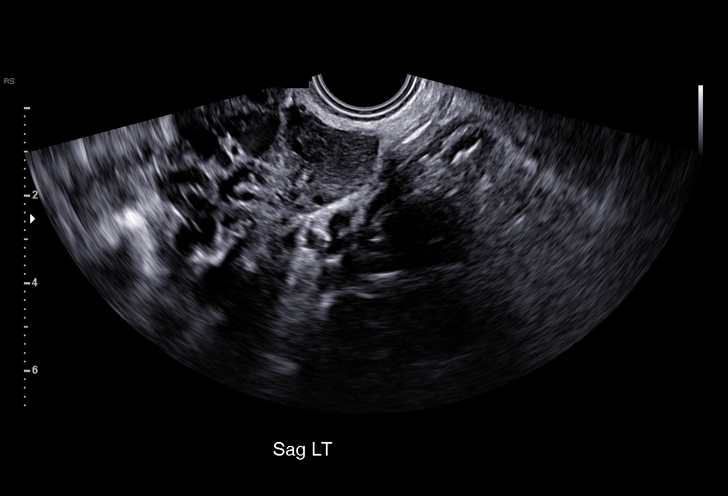
[im 44/85]
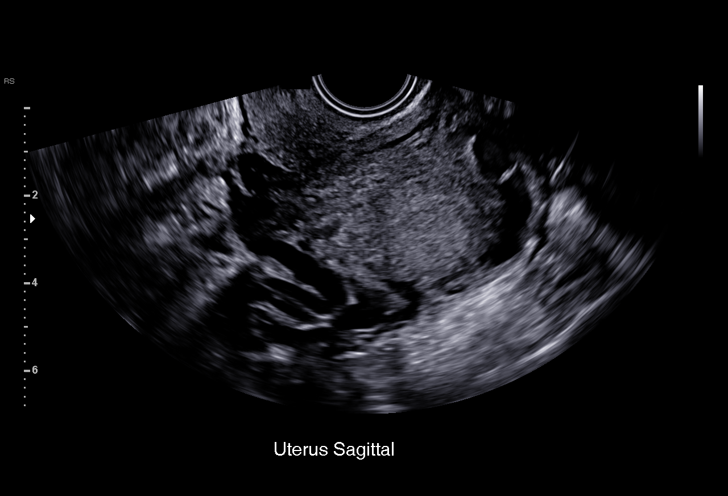
[im 47/85]
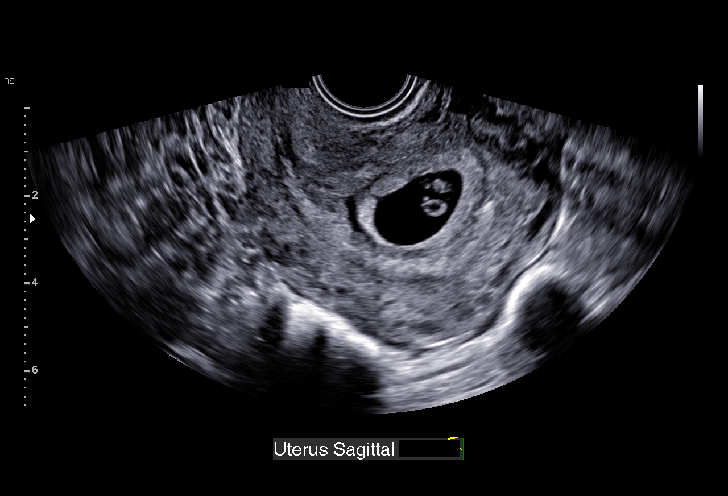
[im 53/85]
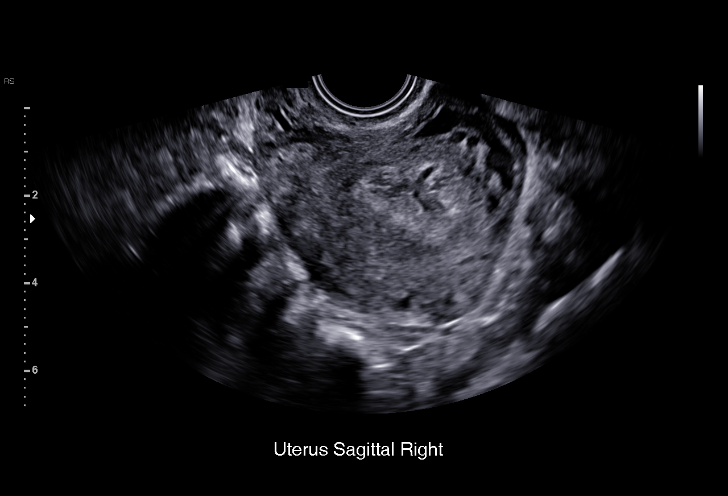
[im 60/85]
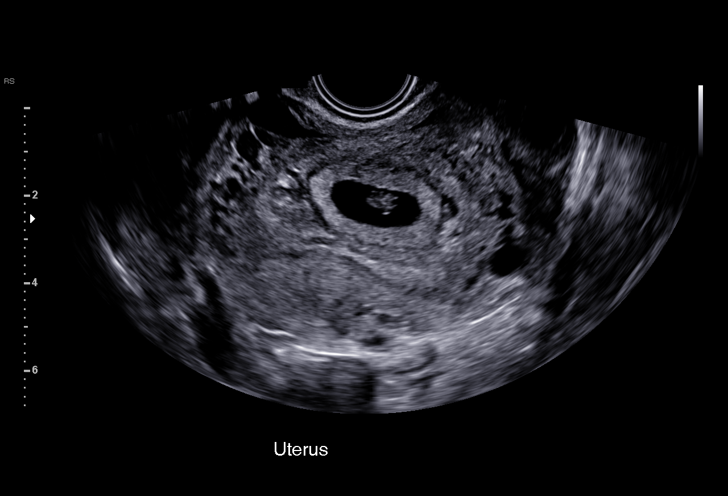
[im 66/85]
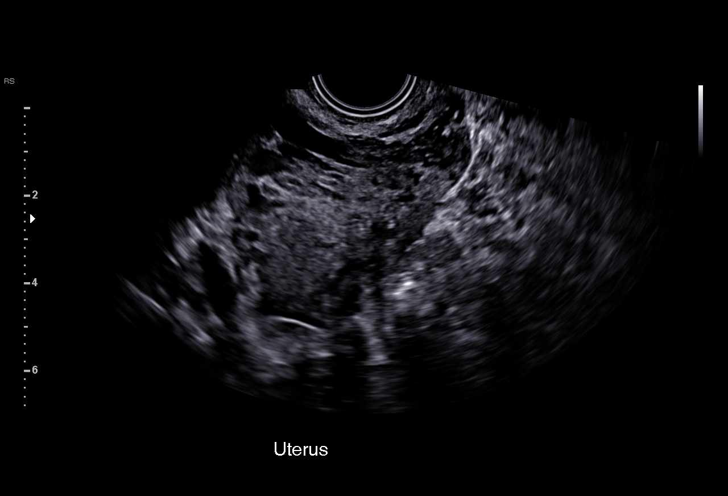
[im 72/85]
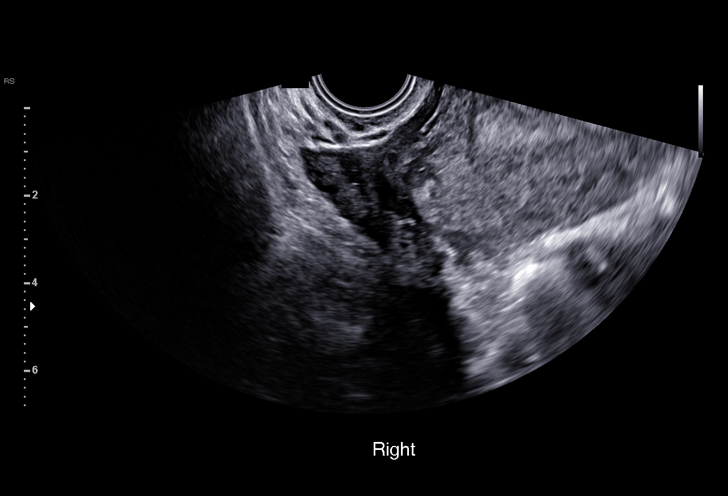
[im 78/85]
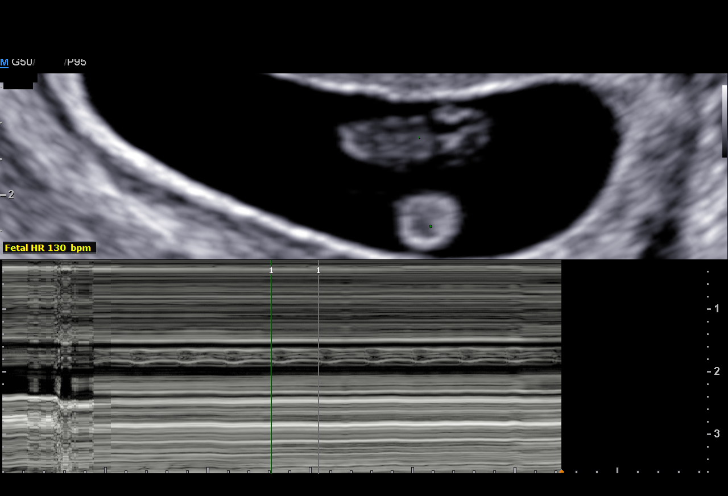
[im 85/85]
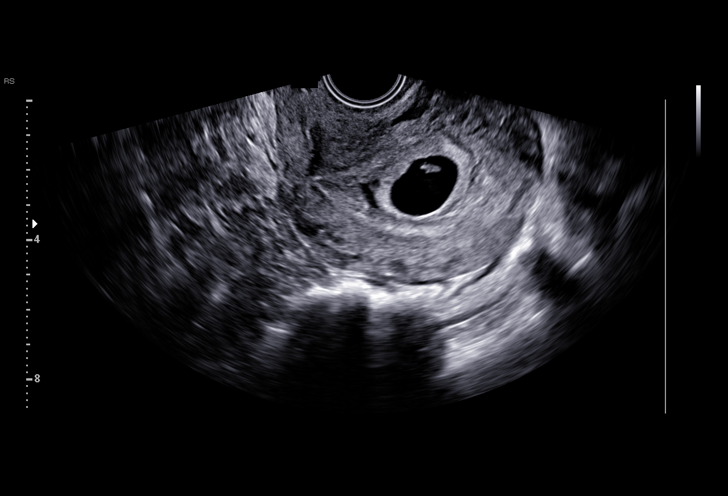

[15 of 28 positions shown; findings below may reference images not displayed]

FINDINGS: Intrauterine gestational sac: Present

Yolk sac:  Present

Embryo:  Present

Cardiac Activity: Present

Heart Rate: 130  bpm

CRL:  8.9  mm   6 w   5 d                  US EDC: 02/20/2017

Subchorionic hemorrhage: Small subchronic hemorrhage 2.3 x 1.3 x
cm.

Maternal uterus/adnexae:

Uterus otherwise normal appearance.

Ovaries normal size morphology with small corpus luteal cyst in LEFT
ovary.

Small amount of free pelvic fluid.

No adnexal masses.
IMPRESSION: Single live intrauterine gestation measured at 6 weeks 5 days EGA by
crown-rump length.

Small subchorionic hemorrhage as above.

Small amount of nonspecific free pelvic fluid.

## 2019-10-02 ENCOUNTER — Emergency Department
Admission: EM | Admit: 2019-10-02 | Discharge: 2019-10-02 | Discharge status: Against Medical Advice | Payer: Medicaid Other | Source: Ambulatory Visit | Attending: Emergency Medicine | Admitting: Emergency Medicine

## 2019-10-02 DIAGNOSIS — Z5321 Procedure and treatment not carried out due to patient leaving prior to being seen by health care provider: Secondary | ICD-10-CM | POA: Insufficient documentation

## 2019-10-02 DIAGNOSIS — Z20822 Contact with and (suspected) exposure to covid-19: Secondary | ICD-10-CM | POA: Insufficient documentation

## 2019-10-02 LAB — HM HIV SCREENING OFFERED

## 2019-10-02 NOTE — ED Triage Notes (Signed)
Pt to the ED via EMS from her work with c/o dizziness, headache, and fever. Pt states the headache began yesterday and the dizziness started today. Pt did take Tylenol around 1600. She c/o fever and chills as well.        Triage Note   Roland Earl, RN

## 2019-10-03 LAB — COVID-19 NAAT (PCR): COVID-19 NAAT (PCR): NEGATIVE

## 2019-10-03 LAB — COVID-19 PCR

## 2020-02-25 LAB — UNMAPPED LAB RESULTS: HIV 1&2 ANTIGEN/ANTIBODY (HT): NONREACTIVE

## 2020-02-26 LAB — UNMAPPED LAB RESULTS
Basophil # (HT): 0.1 10 3/uL (ref 0.0–0.2)
Basophil % (HT): 1 % (ref 0–2)
Eosinophil # (HT): 0.1 10 3/uL (ref 0.0–0.5)
Eosinophil % (HT): 1 % (ref 0–7)
Hematocrit (HT): 36 % (ref 34–47)
Hemoglobin (HGB) (HT): 11.6 g/dL (ref 11.5–16.0)
Lymphocyte # (HT): 3.9 10 3/uL — ABNORMAL HIGH (ref 0.9–3.8)
Lymphocyte % (HT): 74 % — ABNORMAL HIGH (ref 17–44)
MCHC (HT): 31.9 g/dL — ABNORMAL LOW (ref 32.0–36.0)
MCV (HT): 88.6 fL (ref 81.0–99.0)
Mean Corpuscular Hemoglobin (MCH) (HT): 28.2 pg (ref 26.0–34.0)
Monocyte # (HT): 0.6 10 3/uL (ref 0.2–1.0)
Monocyte % (HT): 11 % (ref 4–12)
Neutrophil # (HT): 0.7 10 3/uL — ABNORMAL LOW (ref 1.5–7.7)
Platelets (HT): 138 10 3/uL — ABNORMAL LOW (ref 140–400)
RBC (HT): 4.11 10 6/uL (ref 3.80–5.20)
RDW (HT): 14 % (ref 11.5–15.0)
Seg Neut % (HT): 11 % — ABNORMAL LOW (ref 40–75)
WBC (HT): 5.3 10 3/uL (ref 4.0–10.8)

## 2020-03-17 LAB — UNMAPPED LAB RESULTS
Basophil # (HT): 0 10 3/uL (ref 0.0–0.2)
Basophil % (HT): 0 % (ref 0–2)
Eosinophil # (HT): 0.1 10 3/uL (ref 0.0–0.5)
Eosinophil % (HT): 2 % (ref 0–7)
HIV 1&2 ANTIGEN/ANTIBODY (HT): NONREACTIVE
Hematocrit (HT): 35 % (ref 34–47)
Hemoglobin (HGB) (HT): 11.8 g/dL (ref 11.5–16.0)
Lymphocyte # (HT): 3.5 10 3/uL (ref 0.9–3.8)
Lymphocyte % (HT): 51 % — ABNORMAL HIGH (ref 17–44)
MCHC (HT): 33.3 g/dL (ref 32.0–36.0)
MCV (HT): 86.6 fL (ref 81.0–99.0)
Mean Corpuscular Hemoglobin (MCH) (HT): 28.9 pg (ref 26.0–34.0)
Monocyte # (HT): 0.4 10 3/uL (ref 0.2–1.0)
Monocyte % (HT): 7 % (ref 4–12)
Neutrophil # (HT): 2.7 10 3/uL (ref 1.5–7.7)
Platelets (HT): 162 10 3/uL (ref 140–400)
RBC (HT): 4.09 10 6/uL (ref 3.80–5.20)
RDW (HT): 13.6 % (ref 11.5–15.0)
Seg Neut % (HT): 40 % (ref 40–75)
WBC (HT): 6.8 10 3/uL (ref 4.0–10.8)

## 2020-04-30 LAB — UNMAPPED LAB RESULTS
Basophil # (HT): 0 10 3/uL (ref 0.0–0.2)
Basophil % (HT): 0 % (ref 0–2)
Eosinophil # (HT): 0.1 10 3/uL (ref 0.0–0.5)
Eosinophil % (HT): 1 % (ref 0–7)
HIV 1&2 ANTIGEN/ANTIBODY (HT): NONREACTIVE
Hematocrit (HT): 33 % — ABNORMAL LOW (ref 34–47)
Hemoglobin (HGB) (HT): 10.8 g/dL — ABNORMAL LOW (ref 11.5–16.0)
Lymphocyte # (HT): 2.8 10 3/uL (ref 0.9–3.8)
Lymphocyte % (HT): 49 % — ABNORMAL HIGH (ref 17–44)
MCHC (HT): 32.9 g/dL (ref 32.0–36.0)
MCV (HT): 84.5 fL (ref 81.0–99.0)
Mean Corpuscular Hemoglobin (MCH) (HT): 27.8 pg (ref 26.0–34.0)
Monocyte # (HT): 0.4 10 3/uL (ref 0.2–1.0)
Monocyte % (HT): 7 % (ref 4–12)
Neutrophil # (HT): 2.4 10 3/uL (ref 1.5–7.7)
Platelets (HT): 139 10 3/uL — ABNORMAL LOW (ref 140–400)
RBC (HT): 3.88 10 6/uL (ref 3.80–5.20)
RDW (HT): 13.8 % (ref 11.5–15.0)
Seg Neut % (HT): 42 % (ref 40–75)
WBC (HT): 5.8 10 3/uL (ref 4.0–10.8)

## 2020-05-16 LAB — UNMAPPED LAB RESULTS
Basophil # (HT): 0 10 3/uL (ref 0.0–0.2)
Basophil % (HT): 0 % (ref 0–2)
Eosinophil # (HT): 0.1 10 3/uL (ref 0.0–0.5)
Eosinophil % (HT): 1 % (ref 0–7)
Hematocrit (HT): 32 % — ABNORMAL LOW (ref 34–47)
Hemoglobin (HGB) (HT): 10.2 g/dL — ABNORMAL LOW (ref 11.5–16.0)
Lymphocyte # (HT): 2.6 10 3/uL (ref 0.9–3.8)
Lymphocyte % (HT): 51 % — ABNORMAL HIGH (ref 17–44)
MCHC (HT): 32.2 g/dL (ref 32.0–36.0)
MCV (HT): 85.2 fL (ref 81.0–99.0)
Mean Corpuscular Hemoglobin (MCH) (HT): 27.4 pg (ref 26.0–34.0)
Monocyte # (HT): 0.4 10 3/uL (ref 0.2–1.0)
Monocyte % (HT): 7 % (ref 4–12)
Neutrophil # (HT): 2 10 3/uL (ref 1.5–7.7)
Platelets (HT): 177 10 3/uL (ref 140–400)
RBC (HT): 3.72 10 6/uL — ABNORMAL LOW (ref 3.80–5.20)
RDW (HT): 14.6 % (ref 11.5–15.0)
Seg Neut % (HT): 40 % (ref 40–75)
WBC (HT): 5.1 10 3/uL (ref 4.0–10.8)

## 2020-06-18 LAB — UNMAPPED LAB RESULTS
HIV 1&2 ANTIGEN/ANTIBODY (HT): NONREACTIVE
Hematocrit (HT): 34 % (ref 34–47)
Hemoglobin (HGB) (HT): 10.8 g/dL — ABNORMAL LOW (ref 11.5–16.0)
MCHC (HT): 31.9 g/dL — ABNORMAL LOW (ref 32.0–36.0)
MCV (HT): 84.1 fL (ref 81.0–99.0)
Mean Corpuscular Hemoglobin (MCH) (HT): 26.8 pg (ref 26.0–34.0)
Platelets (HT): 162 10 3/uL (ref 140–400)
RBC (HT): 4.03 10 6/uL (ref 3.80–5.20)
RDW (HT): 15.4 % — ABNORMAL HIGH (ref 11.5–15.0)
WBC (HT): 6.1 10 3/uL (ref 4.0–10.8)

## 2020-06-19 LAB — TREPONEMA PALLIDUM (SYPHILIS) ANTIBODY (HT): Syphilis Treponemal Ab (HT): NONREACTIVE

## 2021-07-25 ENCOUNTER — Other Ambulatory Visit: Payer: Self-pay

## 2021-07-25 ENCOUNTER — Emergency Department
Admission: EM | Admit: 2021-07-25 | Discharge: 2021-07-26 | Discharge status: Against Medical Advice | Payer: Medicaid Other | Source: Ambulatory Visit
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
## 2021-07-26 NOTE — ED Notes (Signed)
Pt left. Will remove from system.

## 2021-07-28 LAB — UNMAPPED LAB RESULTS
Basophil # (HT): 0 10 3/uL (ref 0.0–0.2)
Basophil % (HT): 0 % (ref 0–2)
Eosinophil # (HT): 0.2 10 3/uL (ref 0.0–0.5)
Eosinophil % (HT): 3 % (ref 0–7)
Hematocrit (HT): 39 % (ref 34–47)
Hemoglobin (HGB) (HT): 12.8 g/dL (ref 11.5–16.0)
Lymphocyte # (HT): 3 10 3/uL (ref 0.9–3.8)
Lymphocyte % (HT): 52 % — ABNORMAL HIGH (ref 17–44)
MCHC (HT): 33.2 g/dL (ref 32.0–36.0)
MCV (HT): 88.1 fL (ref 81.0–99.0)
Mean Corpuscular Hemoglobin (MCH) (HT): 29.3 pg (ref 26.0–34.0)
Monocyte # (HT): 0.4 10 3/uL (ref 0.2–1.0)
Monocyte % (HT): 7 % (ref 4–12)
Neutrophil # (HT): 2.2 10 3/uL (ref 1.5–7.7)
Platelets (HT): 134 10 3/uL — ABNORMAL LOW (ref 140–400)
RBC (HT): 4.37 10 6/uL (ref 3.80–5.20)
RDW (HT): 14 % (ref 11.5–15.0)
Seg Neut % (HT): 38 % — ABNORMAL LOW (ref 40–75)
WBC (HT): 5.8 10 3/uL (ref 4.0–10.8)

## 2021-08-04 ENCOUNTER — Encounter: Payer: Self-pay | Admitting: Family Medicine

## 2021-08-04 ENCOUNTER — Other Ambulatory Visit: Payer: Self-pay

## 2021-08-04 ENCOUNTER — Ambulatory Visit: Payer: Auto Insurance (includes no fault) | Admitting: Radiology

## 2021-08-04 ENCOUNTER — Ambulatory Visit
Admission: RE | Admit: 2021-08-04 | Discharge: 2021-08-04 | Disposition: A | Discharge status: Home / Self Care | Payer: Auto Insurance (includes no fault) | Source: Ambulatory Visit | Admitting: Radiology

## 2021-08-04 ENCOUNTER — Emergency Department: Payer: Auto Insurance (includes no fault)

## 2021-08-04 ENCOUNTER — Ambulatory Visit: Payer: Auto Insurance (includes no fault) | Attending: Family Medicine | Admitting: Family Medicine

## 2021-08-04 ENCOUNTER — Emergency Department
Admission: EM | Admit: 2021-08-04 | Discharge: 2021-08-04 | Disposition: A | Discharge status: Home / Self Care | Payer: Auto Insurance (includes no fault) | Source: Ambulatory Visit | Attending: Emergency Medicine | Admitting: Emergency Medicine

## 2021-08-04 VITALS — BP 141/97 | HR 76 | Temp 96.6°F | Resp 18

## 2021-08-04 DIAGNOSIS — S3993XA Unspecified injury of pelvis, initial encounter: Secondary | ICD-10-CM

## 2021-08-04 DIAGNOSIS — S6991XA Unspecified injury of right wrist, hand and finger(s), initial encounter: Secondary | ICD-10-CM

## 2021-08-04 DIAGNOSIS — M25531 Pain in right wrist: Secondary | ICD-10-CM | POA: Insufficient documentation

## 2021-08-04 DIAGNOSIS — Y9389 Activity, other specified: Secondary | ICD-10-CM | POA: Insufficient documentation

## 2021-08-04 DIAGNOSIS — Y92410 Unspecified street and highway as the place of occurrence of the external cause: Secondary | ICD-10-CM | POA: Insufficient documentation

## 2021-08-04 DIAGNOSIS — R1032 Left lower quadrant pain: Secondary | ICD-10-CM

## 2021-08-04 DIAGNOSIS — M25552 Pain in left hip: Secondary | ICD-10-CM | POA: Insufficient documentation

## 2021-08-04 DIAGNOSIS — N939 Abnormal uterine and vaginal bleeding, unspecified: Secondary | ICD-10-CM | POA: Insufficient documentation

## 2021-08-04 DIAGNOSIS — S79912A Unspecified injury of left hip, initial encounter: Secondary | ICD-10-CM | POA: Insufficient documentation

## 2021-08-04 DIAGNOSIS — S3991XA Unspecified injury of abdomen, initial encounter: Secondary | ICD-10-CM

## 2021-08-04 DIAGNOSIS — Z3202 Encounter for pregnancy test, result negative: Secondary | ICD-10-CM | POA: Insufficient documentation

## 2021-08-04 DIAGNOSIS — M791 Myalgia, unspecified site: Secondary | ICD-10-CM

## 2021-08-04 DIAGNOSIS — Y998 Other external cause status: Secondary | ICD-10-CM | POA: Insufficient documentation

## 2021-08-04 DIAGNOSIS — R1031 Right lower quadrant pain: Secondary | ICD-10-CM | POA: Insufficient documentation

## 2021-08-04 DIAGNOSIS — R109 Unspecified abdominal pain: Secondary | ICD-10-CM | POA: Insufficient documentation

## 2021-08-04 LAB — BASIC METABOLIC PANEL
Anion Gap: 13 (ref 7–16)
CO2: 24 mmol/L (ref 20–28)
Calcium: 9.7 mg/dL (ref 8.8–10.2)
Chloride: 104 mmol/L (ref 96–108)
Creatinine: 0.6 mg/dL (ref 0.51–0.95)
Glucose: 73 mg/dL (ref 60–99)
Lab: 6 mg/dL (ref 6–20)
Potassium: 4.2 mmol/L (ref 3.3–5.1)
Sodium: 141 mmol/L (ref 133–145)
eGFR BY CREAT: 124 *

## 2021-08-04 LAB — RUQ PANEL (ED ONLY)
ALT: 20 U/L (ref 0–35)
AST: 21 U/L (ref 0–35)
Albumin: 4.7 g/dL (ref 3.5–5.2)
Alk Phos: 72 U/L (ref 35–105)
Amylase: 75 U/L (ref 28–100)
Bilirubin,Direct: <0.2 mg/dL (ref 0.0–0.3)
Bilirubin,Total: 0.3 mg/dL (ref 0.0–1.2)
Lipase: 18 U/L (ref 13–60)
Total Protein: 7.3 g/dL (ref 6.3–7.7)

## 2021-08-04 LAB — URINE MICROSCOPIC (IQ200)

## 2021-08-04 LAB — URINALYSIS REFLEX TO CULTURE
Glucose,UA: NEGATIVE
Ketones, UA: NEGATIVE
Leuk Esterase,UA: NEGATIVE
Nitrite,UA: NEGATIVE
Protein,UA: NEGATIVE
Specific Gravity,UA: 1.014 (ref 1.002–1.030)
pH,UA: 7 (ref 5.0–8.0)

## 2021-08-04 LAB — CBC AND DIFFERENTIAL
Baso # K/uL: 0 10*3/uL (ref 0.0–0.1)
Basophil %: 0.3 %
Eos # K/uL: 0.1 10*3/uL (ref 0.0–0.4)
Eosinophil %: 2.1 %
Hematocrit: 39 % (ref 34–45)
Hemoglobin: 12.1 g/dL (ref 11.2–15.7)
IMM Granulocytes #: 0 10*3/uL (ref 0.0–0.0)
IMM Granulocytes: 0.3 %
Lymph # K/uL: 2.2 10*3/uL (ref 1.2–3.7)
Lymphocyte %: 57.1 %
MCH: 29 pg (ref 26–32)
MCHC: 31 g/dL — ABNORMAL LOW (ref 32–36)
MCV: 91 fL (ref 79–95)
Mono # K/uL: 0.3 10*3/uL (ref 0.2–0.9)
Monocyte %: 7.9 %
Neut # K/uL: 1.2 10*3/uL — ABNORMAL LOW (ref 1.6–6.1)
Nucl RBC # K/uL: 0 10*3/uL (ref 0.0–0.0)
Nucl RBC %: 0 /100 WBC (ref 0.0–0.2)
Platelets: 95 10*3/uL — ABNORMAL LOW (ref 160–370)
RBC: 4.3 MIL/uL (ref 3.9–5.2)
RDW: 14.6 % — ABNORMAL HIGH (ref 11.7–14.4)
Seg Neut %: 32.3 %
WBC: 3.8 10*3/uL — ABNORMAL LOW (ref 4.0–10.0)

## 2021-08-04 LAB — PREGNANCY, URINE: Preg Test,UR: NEGATIVE

## 2021-08-04 LAB — HOLD BLUE

## 2021-08-04 MED ORDER — HYDROMORPHONE HCL PF 1 MG/ML IJ SOLN *WRAPPED*
0.5000 mg | Freq: Once | INTRAMUSCULAR | Status: AC
Start: 2021-08-04 — End: 2021-08-04
  Administered 2021-08-04: 0.5 mg via INTRAVENOUS
  Filled 2021-08-04: qty 0.5

## 2021-08-04 MED ORDER — IOHEXOL 350 MG/ML (OMNIPAQUE) IV SOLN 500ML BOTTLE *I*
1.0000 mL | Freq: Once | INTRAVENOUS | Status: AC
Start: 2021-08-04 — End: 2021-08-04
  Administered 2021-08-04: 100 mL via INTRAVENOUS

## 2021-08-04 NOTE — ED Notes (Signed)
Appropriate clothing in place. Discharge instructions reviewed and pt verbalized understanding. Pt is leaving ambulatory. Pt tolerating PO intake. Pt will [facility] with [facility]. Pt belongings with pt. Pt safe to D/C a this time.
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
## 2021-08-04 NOTE — Discharge Instructions (Signed)
Alexandra Ray was seen today for abdominal pain. He/She had blood work done including blood counts, abdominal enzymes, electrolytes and urine testing and imaging including an abdominal CT scan. She was evaluated and it was determined that there is no need for hospitalization at this time and he/she is stable for discharge home.  Continue taking any medications as prescribed. We would like you to follow-up with your OBGYN for your vaginal bleeding on depot provera.    If you develop worsening pain despite over the counter medications, persistent vomiting or any other concerning symptoms please contact your primary care doctor or return to the ER.

## 2021-08-05 LAB — HOLD GREEN NO GEL

## 2021-09-28 ENCOUNTER — Emergency Department
Admission: EM | Admit: 2021-09-28 | Discharge: 2021-09-28 | Disposition: A | Discharge status: Home / Self Care | Payer: Medicaid Other | Source: Ambulatory Visit | Attending: Emergency Medicine | Admitting: Emergency Medicine

## 2021-09-28 ENCOUNTER — Other Ambulatory Visit: Payer: Self-pay

## 2021-09-28 ENCOUNTER — Encounter: Payer: Self-pay | Admitting: Emergency Medicine

## 2021-09-28 ENCOUNTER — Emergency Department: Payer: Medicaid Other

## 2021-09-28 DIAGNOSIS — M25531 Pain in right wrist: Secondary | ICD-10-CM | POA: Insufficient documentation

## 2021-09-28 DIAGNOSIS — M654 Radial styloid tenosynovitis [de Quervain]: Secondary | ICD-10-CM | POA: Insufficient documentation

## 2021-09-28 MED ORDER — LIDOCAINE 5 % EX PTCH *I*
1.0000 | MEDICATED_PATCH | CUTANEOUS | Status: DC
Start: 2021-09-28 — End: 2021-09-28
  Administered 2021-09-28: 1 via TRANSDERMAL
  Filled 2021-09-28: qty 1

## 2021-09-28 MED ORDER — ACETAMINOPHEN 325 MG PO TABS *I*
975.0000 mg | ORAL_TABLET | Freq: Once | ORAL | Status: AC
Start: 2021-09-28 — End: 2021-09-28
  Administered 2021-09-28: 975 mg via ORAL
  Filled 2021-09-28: qty 3

## 2021-09-28 MED ORDER — KETOROLAC TROMETHAMINE 30 MG/ML IJ SOLN *I*
30.0000 mg | Freq: Once | INTRAMUSCULAR | Status: AC
Start: 2021-09-28 — End: 2021-09-28
  Administered 2021-09-28: 30 mg via INTRAMUSCULAR
  Filled 2021-09-28: qty 1
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
